# Patient Record
Sex: Female | Born: 1935 | Race: Black or African American | Hispanic: No | Smoking: Never smoker
Health system: Southern US, Community
[De-identification: ages and names within clinical notes are randomized; demographics above are authoritative.]

## PROBLEM LIST (undated history)

## (undated) DIAGNOSIS — E785 Hyperlipidemia, unspecified: Secondary | ICD-10-CM

## (undated) DIAGNOSIS — R1013 Epigastric pain: Secondary | ICD-10-CM

## (undated) DIAGNOSIS — D649 Anemia, unspecified: Secondary | ICD-10-CM

## (undated) DIAGNOSIS — K922 Gastrointestinal hemorrhage, unspecified: Secondary | ICD-10-CM

## (undated) DIAGNOSIS — R911 Solitary pulmonary nodule: Secondary | ICD-10-CM

## (undated) DIAGNOSIS — R531 Weakness: Secondary | ICD-10-CM

## (undated) DIAGNOSIS — I442 Atrioventricular block, complete: Secondary | ICD-10-CM

## (undated) DIAGNOSIS — Z950 Presence of cardiac pacemaker: Secondary | ICD-10-CM

## (undated) DIAGNOSIS — I10 Essential (primary) hypertension: Secondary | ICD-10-CM

## (undated) DIAGNOSIS — M25669 Stiffness of unspecified knee, not elsewhere classified: Secondary | ICD-10-CM

## (undated) DIAGNOSIS — I509 Heart failure, unspecified: Secondary | ICD-10-CM

## (undated) HISTORY — DX: Weakness: R53.1

## (undated) HISTORY — DX: Gastrointestinal hemorrhage, unspecified: K92.2

## (undated) HISTORY — DX: Essential (primary) hypertension: I10

## (undated) HISTORY — DX: Solitary pulmonary nodule: R91.1

## (undated) HISTORY — DX: Stiffness of unspecified knee, not elsewhere classified: M25.669

## (undated) HISTORY — DX: Epigastric pain: R10.13

## (undated) HISTORY — DX: Anemia, unspecified: D64.9

## (undated) HISTORY — PX: COLONOSCOPY: SHX174

## (undated) HISTORY — DX: Hyperlipidemia, unspecified: E78.5

## (undated) HISTORY — PX: UPPER GASTROINTESTINAL ENDOSCOPY: SHX188

## (undated) HISTORY — DX: Heart failure, unspecified: I50.9

## (undated) HISTORY — DX: Presence of cardiac pacemaker: Z95.0

## (undated) HISTORY — PX: CATARACT EXTRACTION: SUR2

---

## 1998-05-12 ENCOUNTER — Encounter (INDEPENDENT_AMBULATORY_CARE_PROVIDER_SITE_OTHER): Payer: Self-pay | Admitting: *Deleted

## 1999-06-09 ENCOUNTER — Encounter: Payer: Self-pay | Admitting: Internal Medicine

## 1999-06-09 ENCOUNTER — Encounter: Admission: RE | Admit: 1999-06-09 | Discharge: 1999-06-09 | Payer: Self-pay | Admitting: Internal Medicine

## 2000-07-03 ENCOUNTER — Encounter: Admission: RE | Admit: 2000-07-03 | Discharge: 2000-07-03 | Payer: Self-pay | Admitting: Internal Medicine

## 2000-07-03 ENCOUNTER — Encounter: Payer: Self-pay | Admitting: Internal Medicine

## 2001-06-19 ENCOUNTER — Encounter: Admission: RE | Admit: 2001-06-19 | Discharge: 2001-06-19 | Payer: Self-pay | Admitting: Internal Medicine

## 2001-06-19 ENCOUNTER — Encounter: Payer: Self-pay | Admitting: Internal Medicine

## 2001-12-12 ENCOUNTER — Ambulatory Visit (HOSPITAL_COMMUNITY): Admission: RE | Admit: 2001-12-12 | Discharge: 2001-12-12 | Payer: Self-pay | Admitting: Internal Medicine

## 2001-12-12 ENCOUNTER — Encounter: Payer: Self-pay | Admitting: Internal Medicine

## 2004-01-01 ENCOUNTER — Encounter: Admission: RE | Admit: 2004-01-01 | Discharge: 2004-01-01 | Payer: Self-pay | Admitting: Internal Medicine

## 2004-03-07 ENCOUNTER — Ambulatory Visit: Payer: Self-pay | Admitting: Family Medicine

## 2004-05-10 ENCOUNTER — Ambulatory Visit: Payer: Self-pay | Admitting: Internal Medicine

## 2004-06-17 ENCOUNTER — Ambulatory Visit: Payer: Self-pay | Admitting: Internal Medicine

## 2004-06-27 ENCOUNTER — Ambulatory Visit: Payer: Self-pay | Admitting: Gastroenterology

## 2004-06-28 ENCOUNTER — Ambulatory Visit: Payer: Self-pay | Admitting: Gastroenterology

## 2004-06-28 ENCOUNTER — Encounter (INDEPENDENT_AMBULATORY_CARE_PROVIDER_SITE_OTHER): Payer: Self-pay | Admitting: Specialist

## 2004-08-02 ENCOUNTER — Ambulatory Visit: Payer: Self-pay | Admitting: Gastroenterology

## 2004-11-01 ENCOUNTER — Ambulatory Visit: Payer: Self-pay | Admitting: Internal Medicine

## 2004-11-30 ENCOUNTER — Ambulatory Visit: Payer: Self-pay | Admitting: Internal Medicine

## 2005-05-11 ENCOUNTER — Ambulatory Visit: Payer: Self-pay | Admitting: Internal Medicine

## 2005-06-16 ENCOUNTER — Encounter: Admission: RE | Admit: 2005-06-16 | Discharge: 2005-06-16 | Payer: Self-pay | Admitting: Internal Medicine

## 2005-10-24 ENCOUNTER — Ambulatory Visit: Payer: Self-pay | Admitting: Internal Medicine

## 2005-11-22 ENCOUNTER — Ambulatory Visit: Payer: Self-pay | Admitting: Internal Medicine

## 2006-02-23 ENCOUNTER — Ambulatory Visit (HOSPITAL_COMMUNITY): Admission: RE | Admit: 2006-02-23 | Discharge: 2006-02-23 | Payer: Self-pay | Admitting: Internal Medicine

## 2006-02-23 ENCOUNTER — Ambulatory Visit: Payer: Self-pay | Admitting: Internal Medicine

## 2006-02-23 LAB — CONVERTED CEMR LAB
Albumin: 3.4 g/dL — ABNORMAL LOW (ref 3.5–5.2)
Alkaline Phosphatase: 66 units/L (ref 39–117)
BUN: 42 mg/dL — ABNORMAL HIGH (ref 6–23)
CO2: 30 meq/L (ref 19–32)
Calcium: 10 mg/dL (ref 8.4–10.5)
GFR calc Af Amer: 46 mL/min
GFR calc non Af Amer: 38 mL/min
Glucose, Bld: 134 mg/dL — ABNORMAL HIGH (ref 70–99)
HCT: 30.3 % — ABNORMAL LOW (ref 36.0–46.0)
Hemoglobin, Urine: NEGATIVE
Hemoglobin: 9.4 g/dL — ABNORMAL LOW (ref 12.0–15.0)
Ketones, ur: NEGATIVE mg/dL
Leukocytes, UA: NEGATIVE
Lipase: 28 units/L (ref 11.0–59.0)
Lymphocytes Relative: 12.5 % (ref 12.0–46.0)
MCHC: 31 g/dL (ref 30.0–36.0)
MCV: 88.3 fL (ref 78.0–100.0)
Monocytes Absolute: 0.6 10*3/uL (ref 0.2–0.7)
Monocytes Relative: 4 % (ref 3.0–11.0)
Nitrite: NEGATIVE
Potassium: 3.9 meq/L (ref 3.5–5.1)
RBC: 3.43 M/uL — ABNORMAL LOW (ref 3.87–5.11)
Specific Gravity, Urine: 1.015 (ref 1.000–1.03)
Total Bilirubin: 0.5 mg/dL (ref 0.3–1.2)
Total Protein, Urine: NEGATIVE mg/dL
Urine Glucose: NEGATIVE mg/dL
Urobilinogen, UA: 0.2 (ref 0.0–1.0)

## 2006-02-26 ENCOUNTER — Inpatient Hospital Stay (HOSPITAL_COMMUNITY): Admission: EM | Admit: 2006-02-26 | Discharge: 2006-03-03 | Payer: Self-pay | Admitting: Emergency Medicine

## 2006-02-26 ENCOUNTER — Encounter (INDEPENDENT_AMBULATORY_CARE_PROVIDER_SITE_OTHER): Payer: Self-pay | Admitting: *Deleted

## 2006-02-26 ENCOUNTER — Ambulatory Visit: Payer: Self-pay | Admitting: Internal Medicine

## 2006-03-05 ENCOUNTER — Ambulatory Visit: Payer: Self-pay | Admitting: Internal Medicine

## 2006-03-12 ENCOUNTER — Ambulatory Visit: Payer: Self-pay | Admitting: Internal Medicine

## 2006-03-12 LAB — CONVERTED CEMR LAB: HCT: 28.2 % — ABNORMAL LOW (ref 36.0–46.0)

## 2006-03-26 ENCOUNTER — Ambulatory Visit: Payer: Self-pay | Admitting: Gastroenterology

## 2006-05-08 ENCOUNTER — Ambulatory Visit: Payer: Self-pay | Admitting: Gastroenterology

## 2006-05-08 LAB — HM COLONOSCOPY

## 2006-07-30 ENCOUNTER — Ambulatory Visit: Payer: Self-pay | Admitting: Internal Medicine

## 2006-07-30 LAB — CONVERTED CEMR LAB
CO2: 30 meq/L (ref 19–32)
Cholesterol: 192 mg/dL (ref 0–200)
Creatinine, Ser: 1.1 mg/dL (ref 0.4–1.2)
GFR calc Af Amer: 61 mL/min
LDL Cholesterol: 118 mg/dL — ABNORMAL HIGH (ref 0–99)
Potassium: 3.5 meq/L (ref 3.5–5.1)
Total CHOL/HDL Ratio: 4
Triglycerides: 133 mg/dL (ref 0–149)

## 2006-07-31 ENCOUNTER — Ambulatory Visit: Payer: Self-pay | Admitting: Cardiology

## 2006-08-07 ENCOUNTER — Encounter: Admission: RE | Admit: 2006-08-07 | Discharge: 2006-08-07 | Payer: Self-pay | Admitting: Internal Medicine

## 2006-08-07 LAB — HM MAMMOGRAPHY: HM Mammogram: NORMAL

## 2006-11-15 ENCOUNTER — Ambulatory Visit: Payer: Self-pay | Admitting: Internal Medicine

## 2007-01-22 ENCOUNTER — Encounter: Payer: Self-pay | Admitting: Internal Medicine

## 2007-01-22 ENCOUNTER — Ambulatory Visit: Payer: Self-pay | Admitting: Internal Medicine

## 2007-01-22 DIAGNOSIS — D649 Anemia, unspecified: Secondary | ICD-10-CM

## 2007-01-22 DIAGNOSIS — H409 Unspecified glaucoma: Secondary | ICD-10-CM | POA: Insufficient documentation

## 2007-01-22 DIAGNOSIS — I10 Essential (primary) hypertension: Secondary | ICD-10-CM | POA: Insufficient documentation

## 2007-01-22 DIAGNOSIS — M810 Age-related osteoporosis without current pathological fracture: Secondary | ICD-10-CM | POA: Insufficient documentation

## 2007-01-22 DIAGNOSIS — E785 Hyperlipidemia, unspecified: Secondary | ICD-10-CM

## 2007-01-22 DIAGNOSIS — J984 Other disorders of lung: Secondary | ICD-10-CM

## 2007-01-22 LAB — CONVERTED CEMR LAB
Albumin: 3.6 g/dL (ref 3.5–5.2)
BUN: 15 mg/dL (ref 6–23)
Basophils Absolute: 0 10*3/uL (ref 0.0–0.1)
Basophils Relative: 0.3 % (ref 0.0–1.0)
CO2: 29 meq/L (ref 19–32)
Calcium: 9.7 mg/dL (ref 8.4–10.5)
Chloride: 106 meq/L (ref 96–112)
Cholesterol: 203 mg/dL (ref 0–200)
Creatinine, Ser: 1.2 mg/dL (ref 0.4–1.2)
Eosinophils Absolute: 0.1 10*3/uL (ref 0.0–0.6)
Eosinophils Relative: 1.3 % (ref 0.0–5.0)
GFR calc Af Amer: 55 mL/min
MCV: 87.7 fL (ref 78.0–100.0)
Monocytes Relative: 6.1 % (ref 3.0–11.0)
Neutrophils Relative %: 67.1 % (ref 43.0–77.0)
RBC: 4.02 M/uL (ref 3.87–5.11)
RDW: 13.8 % (ref 11.5–14.6)
Sodium: 144 meq/L (ref 135–145)
Total Bilirubin: 0.7 mg/dL (ref 0.3–1.2)
Triglycerides: 105 mg/dL (ref 0–149)
WBC: 8.4 10*3/uL (ref 4.5–10.5)

## 2007-01-23 ENCOUNTER — Telehealth: Payer: Self-pay | Admitting: Internal Medicine

## 2007-01-24 ENCOUNTER — Encounter: Payer: Self-pay | Admitting: Internal Medicine

## 2007-02-12 ENCOUNTER — Ambulatory Visit: Payer: Self-pay | Admitting: Cardiology

## 2007-02-14 ENCOUNTER — Encounter: Payer: Self-pay | Admitting: Internal Medicine

## 2007-08-01 ENCOUNTER — Ambulatory Visit: Payer: Self-pay | Admitting: Internal Medicine

## 2007-08-01 DIAGNOSIS — R5381 Other malaise: Secondary | ICD-10-CM

## 2007-08-01 DIAGNOSIS — K3189 Other diseases of stomach and duodenum: Secondary | ICD-10-CM

## 2007-08-01 DIAGNOSIS — R1013 Epigastric pain: Secondary | ICD-10-CM

## 2007-08-01 DIAGNOSIS — R5383 Other fatigue: Secondary | ICD-10-CM

## 2007-08-01 LAB — CONVERTED CEMR LAB
BUN: 21 mg/dL (ref 6–23)
Basophils Absolute: 0 10*3/uL (ref 0.0–0.1)
Basophils Relative: 0.2 % (ref 0.0–1.0)
CO2: 27 meq/L (ref 19–32)
Calcium: 9.6 mg/dL (ref 8.4–10.5)
Chloride: 108 meq/L (ref 96–112)
Eosinophils Relative: 0.7 % (ref 0.0–5.0)
GFR calc non Af Amer: 45 mL/min
Glucose, Bld: 92 mg/dL (ref 70–99)
Hemoglobin: 11.1 g/dL — ABNORMAL LOW (ref 12.0–15.0)
Iron: 76 ug/dL (ref 42–145)
Monocytes Absolute: 0.2 10*3/uL (ref 0.1–1.0)
Neutro Abs: 5 10*3/uL (ref 1.4–7.7)
RBC: 3.83 M/uL — ABNORMAL LOW (ref 3.87–5.11)
Sed Rate: 18 mm/hr (ref 0–22)
Sodium: 145 meq/L (ref 135–145)
Transferrin: 260.7 mg/dL (ref >=212.0)

## 2007-08-05 ENCOUNTER — Encounter: Payer: Self-pay | Admitting: Internal Medicine

## 2007-11-22 ENCOUNTER — Ambulatory Visit: Payer: Self-pay | Admitting: Internal Medicine

## 2008-01-27 ENCOUNTER — Ambulatory Visit: Payer: Self-pay | Admitting: Internal Medicine

## 2008-01-27 LAB — CONVERTED CEMR LAB
Albumin: 3.5 g/dL (ref 3.5–5.2)
Alkaline Phosphatase: 70 units/L (ref 39–117)
BUN: 19 mg/dL (ref 6–23)
Basophils Absolute: 0 10*3/uL (ref 0.0–0.1)
Basophils Relative: 0.6 % (ref 0.0–3.0)
Bilirubin, Direct: 0.1 mg/dL (ref 0.0–0.3)
Chloride: 107 meq/L (ref 96–112)
Cholesterol: 190 mg/dL (ref 0–200)
Eosinophils Relative: 1 % (ref 0.0–5.0)
GFR calc non Af Amer: 50 mL/min
Glucose, Bld: 91 mg/dL (ref 70–99)
LDL Cholesterol: 110 mg/dL — ABNORMAL HIGH (ref 0–99)
MCHC: 33.4 g/dL (ref 30.0–36.0)
Monocytes Relative: 6.4 % (ref 3.0–12.0)
Potassium: 4.1 meq/L (ref 3.5–5.1)
RDW: 13.1 % (ref 11.5–14.6)
Total CHOL/HDL Ratio: 4.2
Total Protein: 6.8 g/dL (ref 6.0–8.3)

## 2008-01-28 ENCOUNTER — Encounter: Payer: Self-pay | Admitting: Internal Medicine

## 2008-02-18 ENCOUNTER — Ambulatory Visit: Payer: Self-pay | Admitting: Cardiovascular Disease

## 2008-02-26 ENCOUNTER — Encounter: Payer: Self-pay | Admitting: Internal Medicine

## 2008-03-02 ENCOUNTER — Telehealth: Payer: Self-pay | Admitting: Internal Medicine

## 2008-03-10 ENCOUNTER — Ambulatory Visit (HOSPITAL_COMMUNITY): Admission: RE | Admit: 2008-03-10 | Discharge: 2008-03-10 | Payer: Self-pay | Admitting: Internal Medicine

## 2008-03-16 ENCOUNTER — Encounter: Payer: Self-pay | Admitting: Internal Medicine

## 2008-08-03 ENCOUNTER — Ambulatory Visit: Payer: Self-pay | Admitting: Internal Medicine

## 2008-08-03 LAB — CONVERTED CEMR LAB
Alkaline Phosphatase: 71 units/L (ref 39–117)
BUN: 22 mg/dL (ref 6–23)
Basophils Absolute: 0 10*3/uL (ref 0.0–0.1)
CO2: 29 meq/L (ref 19–32)
Chloride: 107 meq/L (ref 96–112)
Direct LDL: 131.3 mg/dL
Eosinophils Absolute: 0.1 10*3/uL (ref 0.0–0.7)
Eosinophils Relative: 1.7 % (ref 0.0–5.0)
GFR calc non Af Amer: 54.4 mL/min (ref >60)
Glucose, Bld: 90 mg/dL (ref 70–99)
HCT: 33.1 % — ABNORMAL LOW (ref 36.0–46.0)
Lymphocytes Relative: 27.7 % (ref 12.0–46.0)
Lymphs Abs: 2 10*3/uL (ref 0.7–4.0)
MCHC: 32.1 g/dL (ref 30.0–36.0)
MCV: 89.1 fL (ref 78.0–100.0)
Monocytes Absolute: 0.4 10*3/uL (ref 0.1–1.0)
Monocytes Relative: 5.8 % (ref 3.0–12.0)
Neutro Abs: 4.8 10*3/uL (ref 1.4–7.7)
Neutrophils Relative %: 64.3 % (ref 43.0–77.0)
Platelets: 204 10*3/uL (ref 150.0–400.0)
RDW: 13.3 % (ref 11.5–14.6)
Sodium: 147 meq/L — ABNORMAL HIGH (ref 135–145)
Total CHOL/HDL Ratio: 4

## 2008-08-11 ENCOUNTER — Ambulatory Visit: Payer: Self-pay | Admitting: Cardiology

## 2008-08-18 ENCOUNTER — Telehealth: Payer: Self-pay | Admitting: Internal Medicine

## 2008-09-15 ENCOUNTER — Telehealth: Payer: Self-pay | Admitting: Internal Medicine

## 2008-11-17 ENCOUNTER — Ambulatory Visit: Payer: Self-pay | Admitting: Internal Medicine

## 2009-01-12 ENCOUNTER — Ambulatory Visit: Payer: Self-pay | Admitting: Internal Medicine

## 2009-01-14 ENCOUNTER — Telehealth: Payer: Self-pay | Admitting: Internal Medicine

## 2009-01-14 ENCOUNTER — Ambulatory Visit: Payer: Self-pay | Admitting: Internal Medicine

## 2009-01-14 LAB — CONVERTED CEMR LAB
Ketones, ur: NEGATIVE mg/dL
Nitrite: POSITIVE
Specific Gravity, Urine: 1.02 (ref 1.000–1.030)
Total Protein, Urine: 100 mg/dL
Urobilinogen, UA: 0.2 (ref 0.0–1.0)
pH: 5 (ref 5.0–8.0)

## 2009-02-03 ENCOUNTER — Telehealth (INDEPENDENT_AMBULATORY_CARE_PROVIDER_SITE_OTHER): Payer: Self-pay | Admitting: *Deleted

## 2009-02-03 ENCOUNTER — Encounter (INDEPENDENT_AMBULATORY_CARE_PROVIDER_SITE_OTHER): Payer: Self-pay | Admitting: *Deleted

## 2009-07-27 ENCOUNTER — Ambulatory Visit: Payer: Self-pay | Admitting: Internal Medicine

## 2009-07-27 DIAGNOSIS — K5900 Constipation, unspecified: Secondary | ICD-10-CM | POA: Insufficient documentation

## 2009-07-27 LAB — CONVERTED CEMR LAB
AST: 27 units/L (ref 0–37)
Albumin: 4.1 g/dL (ref 3.5–5.2)
BUN: 28 mg/dL — ABNORMAL HIGH (ref 6–23)
Basophils Absolute: 0 10*3/uL (ref 0.0–0.1)
Basophils Relative: 0.3 % (ref 0.0–3.0)
Bilirubin, Direct: 0.7 mg/dL — ABNORMAL HIGH (ref 0.0–0.3)
CO2: 27 meq/L (ref 19–32)
Chloride: 110 meq/L (ref 96–112)
Eosinophils Absolute: 0.1 10*3/uL (ref 0.0–0.7)
Glucose, Bld: 99 mg/dL (ref 70–99)
LDL Cholesterol: 111 mg/dL — ABNORMAL HIGH (ref 0–99)
Lymphs Abs: 2.4 10*3/uL (ref 0.7–4.0)
Neutro Abs: 4.8 10*3/uL (ref 1.4–7.7)
Neutrophils Relative %: 62 % (ref 43.0–77.0)
RBC: 3.78 M/uL — ABNORMAL LOW (ref 3.87–5.11)
RDW: 14.9 % — ABNORMAL HIGH (ref 11.5–14.6)
Sodium: 146 meq/L — ABNORMAL HIGH (ref 135–145)
WBC: 7.7 10*3/uL (ref 4.5–10.5)

## 2009-08-03 ENCOUNTER — Ambulatory Visit: Payer: Self-pay | Admitting: Internal Medicine

## 2009-08-08 ENCOUNTER — Encounter: Payer: Self-pay | Admitting: Internal Medicine

## 2009-10-01 ENCOUNTER — Telehealth: Payer: Self-pay | Admitting: Internal Medicine

## 2009-10-27 ENCOUNTER — Ambulatory Visit: Payer: Self-pay | Admitting: Internal Medicine

## 2009-11-04 ENCOUNTER — Telehealth: Payer: Self-pay | Admitting: Internal Medicine

## 2009-11-08 ENCOUNTER — Telehealth: Payer: Self-pay | Admitting: Internal Medicine

## 2009-11-19 ENCOUNTER — Ambulatory Visit: Payer: Self-pay | Admitting: Internal Medicine

## 2009-12-14 ENCOUNTER — Telehealth: Payer: Self-pay | Admitting: Internal Medicine

## 2010-02-17 ENCOUNTER — Other Ambulatory Visit: Payer: Self-pay | Admitting: Internal Medicine

## 2010-02-17 ENCOUNTER — Ambulatory Visit
Admission: RE | Admit: 2010-02-17 | Discharge: 2010-02-17 | Payer: Self-pay | Source: Home / Self Care | Attending: Internal Medicine | Admitting: Internal Medicine

## 2010-02-17 DIAGNOSIS — M25669 Stiffness of unspecified knee, not elsewhere classified: Secondary | ICD-10-CM | POA: Insufficient documentation

## 2010-02-17 LAB — BASIC METABOLIC PANEL
BUN: 28 mg/dL — ABNORMAL HIGH (ref 6–23)
CO2: 22 mEq/L (ref 19–32)
Calcium: 9.3 mg/dL (ref 8.4–10.5)
Chloride: 112 mEq/L (ref 96–112)
Creatinine, Ser: 1.5 mg/dL — ABNORMAL HIGH (ref 0.4–1.2)
GFR: 42.56 mL/min — ABNORMAL LOW (ref >60.00)
Glucose, Bld: 76 mg/dL (ref 70–99)
Potassium: 4.2 mEq/L (ref 3.5–5.1)
Sodium: 143 mEq/L (ref 135–145)

## 2010-02-17 LAB — CBC WITH DIFFERENTIAL/PLATELET
Basophils Absolute: 0 10*3/uL (ref 0.0–0.1)
Basophils Relative: 0.1 % (ref 0.0–3.0)
Eosinophils Absolute: 0.1 10*3/uL (ref 0.0–0.7)
Eosinophils Relative: 0.9 % (ref 0.0–5.0)
HCT: 34.5 % — ABNORMAL LOW (ref 36.0–46.0)
Hemoglobin: 11.1 g/dL — ABNORMAL LOW (ref 12.0–15.0)
Lymphocytes Relative: 30.2 % (ref 12.0–46.0)
Lymphs Abs: 2.2 10*3/uL (ref 0.7–4.0)
MCHC: 32.2 g/dL (ref 30.0–36.0)
MCV: 88.9 fl (ref 78.0–100.0)
Monocytes Absolute: 0.4 10*3/uL (ref 0.1–1.0)
Monocytes Relative: 6.1 % (ref 3.0–12.0)
Neutro Abs: 4.5 10*3/uL (ref 1.4–7.7)
Neutrophils Relative %: 62.7 % (ref 43.0–77.0)
Platelets: 228 10*3/uL (ref 150.0–400.0)
RBC: 3.89 Mil/uL (ref 3.87–5.11)
RDW: 14.9 % — ABNORMAL HIGH (ref 11.5–14.6)
WBC: 7.2 10*3/uL (ref 4.5–10.5)

## 2010-02-17 LAB — B12 AND FOLATE PANEL
Folate: 11.4 ng/mL
Vitamin B-12: 237 pg/mL (ref 211–911)

## 2010-02-17 LAB — IBC PANEL
Iron: 87 ug/dL (ref 42–145)
Saturation Ratios: 23.6 % (ref 20.0–50.0)
Transferrin: 263 mg/dL (ref 212.0–360.0)

## 2010-02-27 ENCOUNTER — Encounter: Payer: Self-pay | Admitting: Internal Medicine

## 2010-02-28 ENCOUNTER — Encounter: Payer: Self-pay | Admitting: Internal Medicine

## 2010-03-10 NOTE — Assessment & Plan Note (Signed)
Summary: PER [facility] DOUBLEBOOK FOR A 945A APPT--FREQ BM-WEAK-STC   Vital Signs:  Patient profile:   77 year old female Height:      65 inches Weight:      121 pounds BMI:     20.21 O2 Sat:      99 % on Room air Temp:     97.1 degrees F oral Pulse rate:   87 / minute BP sitting:   128 / 80  (left arm) Cuff size:   regular  Vitals Entered By: Bill Salinas CMA (October 27, 2009 10:02 AM)  O2 Flow:  Room air CC: pt c/o freq bms x several weeks coming and going with occasional bloating . She Denies blood in her stool/ ab   Primary Care Provider:  [facility]  CC:  pt c/o freq bms x several weeks coming and going with occasional bloating . She Denies blood in her stool/ ab.  History of Present Illness: Patient presents with a month history of increased frequency of BMs of loose stool. She has up to 4 loose stools a night. She has had some low abdominal pain but no severe pain or fever. There has been no blood or mucus in the stool. No new foods or new restaurants, no out of state travel, no exposure risk.   Current Medications (verified): 1)  Enalapril-Hydrochlorothiazide 10-25 Mg Tabs (Enalapril-Hydrochlorothiazide) .... Take 1 Tablet By Mouth Once A Day 2)  Lovastatin 20 Mg  Tabs (Lovastatin) .... Take 1 Tablet By Mouth Once A Day 3)  Loratadine 10 Mg  Tabs (Loratadine) .... Take 1 Tablet By Mouth Once A Day As Needed 4)  Aleve 220 Mg  Tabs (Naproxen Sodium) .... As Needed 5)  Tums 500 Mg  Chew (Calcium Carbonate Antacid) .... As Needed 6)  Evista 60 Mg  Tabs (Raloxifene Hcl) .... Take 1 Tablet By Mouth Once A Day 7)  Xalatan 0.005 %  Soln (Latanoprost) .... At Bedtime 8)  Ranitidine Hcl 150 Mg Caps (Ranitidine Hcl) .Marland Kitchen.. 1 By Mouth Two Times A Day X 2 Weeks Then At Bedtime 9)  Metamucil 30.9 % Powd (Psyllium) .... One Dose Once Daily  Allergies (verified): 1)  Actonel (Risedronate Sodium)  Past History:  Past Medical History: Last updated:  01/22/2007 Hyperlipidemia Hypertension Osteoporosis GLAUCOMA (ICD-365.9)  Past Surgical History: Last updated: 01/22/2007 Cataract extraction PSH reviewed for relevance, FH reviewed for relevance  Review of Systems  The patient denies anorexia, fever, weight loss, decreased hearing, hoarseness, chest pain, dyspnea on exertion, headaches, abdominal pain, severe indigestion/heartburn, hematuria, difficulty walking, unusual weight change, and enlarged lymph nodes.    Physical Exam  General:  elderly well preserved AA female in no acute distress Head:  normocephalic and atraumatic.   Eyes:  C&S clear Neck:  supple.   Lungs:  normal respiratory effort and normal breath sounds.   Heart:  normal rate and regular rhythm.   Abdomen:   BS + x 4 quadrants - hollow sound with rushes; no guarding or rebound tenderness, no tenderness to percussion or palpation.  Skin:  turgor normal and color normal.  No jaundice Psych:  Oriented X3 and memory intact for recent and remote.     Impression & Recommendations:  Problem # 1:  LOOSE STOOLS (ICD-787.91) Suspect mild IBS type symptoms with no evidence of infection or obstruction.  Plan - 2 view abdomen to r/o obstruction, free air.           increase bulk laxative dosing, if no improvement will  try cholestyramine.  Orders: T-Abdomen 2-view ([id number])  Addendum - x-ray with no acute abnormalities.   Complete Medication List: 1)  Enalapril-hydrochlorothiazide 10-25 Mg Tabs (Enalapril-hydrochlorothiazide) .... Take 1 tablet by mouth once a day 2)  Lovastatin 20 Mg Tabs (Lovastatin) .... Take 1 tablet by mouth once a day 3)  Loratadine 10 Mg Tabs (Loratadine) .... Take 1 tablet by mouth once a day as needed 4)  Aleve 220 Mg Tabs (Naproxen sodium) .... As needed 5)  Tums 500 Mg Chew (Calcium carbonate antacid) .... As needed 6)  Evista 60 Mg Tabs (Raloxifene hcl) .... Take 1 tablet by mouth once a day 7)  Xalatan 0.005 % Soln (Latanoprost) ....  At bedtime 8)  Ranitidine Hcl 150 Mg Caps (Ranitidine hcl) .Marland Kitchen.. 1 by mouth two times a day x 2 weeks then at bedtime 9)  Metamucil 30.9 % Powd (Psyllium) .... One dose once daily  Other Orders: Flu Vaccine 66yrs + MEDICARE PATIENTS (J1914) Administration Flu vaccine - MCR (N8295)  Patient Instructions: 1)  Loose and frequent stooling - exam is negative for any sign of infection. No sign or findings to suggestion obstruction. There is gaseous distention. Plan - two view x-ray of abdomen; increase metamucil to twice a day. If these doesn't regulate the bowels call and cholestyramine will be prescribed. Call for pain, fever, stool with blood or mucus.  2)  Knee pain - osteoarthritis. Take tylenol for pain - 500mg  to 1000mg  up to three times a day. If not adequate relief take aleve.      Flu Vaccine Consent Questions     Do you have a history of severe allergic reactions to this vaccine? no    Any prior history of allergic reactions to egg and/or gelatin? no    Do you have a sensitivity to the preservative Thimersol? no    Do you have a past history of Guillan-Barre Syndrome? no    Do you currently have an acute febrile illness? no    Have you ever had a severe reaction to latex? no    Vaccine information given and explained to patient? yes    Are you currently pregnant? no    Lot Number:[id number]   Exp Date:08/06/2010   Site Given  Left Deltoid IMflu

## 2010-03-10 NOTE — Progress Notes (Signed)
Summary: RX  Phone Note Call from Patient Call back at Home Phone [phone]   Caller: Daughter [name] [phone]  or [name] [phone] Summary of Call: Pt has not gotten relief or loose stools as discussed from metimucil. They would like rx discussed at last office visit.   Copied & pasted from last ov notes:  If these doesn't regulate the bowels call and cholestyramine will be prescribed  Please give details of rx, thanks Initial call taken by: Lamar Sprinkles, CMA,  November 04, 2009 12:08 PM  Follow-up for Phone Call        Rx - cholesytramine 4g packets: #30, 1 pkt in water two times a day.  Follow-up by: Jacques Navy MD,  November 04, 2009 12:57 PM  Additional Follow-up for Phone Call Additional follow up Details #1::        Pt's daughter [name] informed in detailed and expressed understanding. Additional Follow-up by: Margaret Pyle, CMA,  November 04, 2009 3:51 PM    New/Updated Medications: CHOLESTYRAMINE 4 GM PACK (CHOLESTYRAMINE) 1 packet in water two times a day Prescriptions: CHOLESTYRAMINE 4 GM PACK (CHOLESTYRAMINE) 1 packet in water two times a day  #60 x 1   Entered by:   Lamar Sprinkles, CMA   Authorized by:   Jacques Navy MD   Signed by:   Lamar Sprinkles, CMA on 11/04/2009   Method used:   Electronically to        [facility] [phone]* (retail)       [address]       [place], [place]  [address]       Ph: [phone] or [phone]       Fax: [phone]   RxID:   [id number]

## 2010-03-10 NOTE — Assessment & Plan Note (Signed)
Summary: f/u appt/cd   Vital Signs:  Patient profile:   77 year old female Height:      65 inches Weight:      121 pounds BMI:     20.21 Temp:     97.5 degrees F oral Pulse rate:   76 / minute Pulse rhythm:   regular Resp:     16 per minute BP sitting:   136 / 76  (right arm) Cuff size:   regular  Vitals Entered By: Lanier Prude, CMA(AAMA) (February 17, 2010 1:12 PM) CC: f/u  Is Patient Diabetic? No Comments pt is not taking prometh/codeine syrup   Primary Care Provider:  Zahmir Lalla  CC:  f/u .  History of Present Illness: Last seen in october for increased  bowel frequency.She was started on cholestyraimne two times a day but had slowed bowels and cut back to 1 dose a day. She is doing bewtter. she has done a lot better with ranitidine two times a day in terms of abdominal pain .  She reports that she has stiffness in her legs, right more then left. No swelling or synvoial thickening.   She has a poor appetite and has lost 1 lb per month. She had been on elder tonic in the past which did help her appetitie. Recommend she restart this.   Current Medications (verified): 1)  Enalapril-Hydrochlorothiazide 10-25 Mg Tabs (Enalapril-Hydrochlorothiazide) .... Take 1 Tablet By Mouth Once A Day 2)  Loratadine 10 Mg  Tabs (Loratadine) .... Take 1 Tablet By Mouth Once A Day As Needed 3)  Aleve 220 Mg  Tabs (Naproxen Sodium) .... As Needed 4)  Tums 500 Mg  Chew (Calcium Carbonate Antacid) .... As Needed 5)  Xalatan 0.005 %  Soln (Latanoprost) .... At Bedtime 6)  Ranitidine Hcl 150 Mg Caps (Ranitidine Hcl) .Marland Kitchen.. 1 By Mouth Two Times A Day X 2 Weeks Then At Bedtime 7)  Cholestyramine 4 Gm Pack (Cholestyramine) .Marland Kitchen.. 1 Packet in Water Two Times A Day 8)  Promethazine-Codeine 6.25-10 Mg/6ml Syrp (Promethazine-Codeine) .Marland Kitchen.. 1 Tsp Every 6 Hours As Needed Cough  Allergies (verified): 1)  Actonel (Risedronate Sodium)  Past History:  Past Medical History: Last updated:  01/22/2007 Hyperlipidemia Hypertension Osteoporosis GLAUCOMA (ICD-365.9)  Past Surgical History: Last updated: 01/22/2007 Cataract extraction  Social History: Last updated: 07/27/2009 has a very supportive family. remains I-ADLs except for driving. End of Life - Care: discussed with patinet and dtr: DNR. Provided end of life materials, out of facility order and MOST form.  Review of Systems  The patient denies anorexia, fever, weight loss, decreased hearing, hoarseness, chest pain, dyspnea on exertion, abdominal pain, muscle weakness, difficulty walking, abnormal bleeding, and enlarged lymph nodes.    Physical Exam  General:  elderly AA woman in no distress Head:  Normocephalic and atraumatic without obvious abnormalities. No apparent alopecia or balding. Eyes:  pupils equal and pupils round.  C&S clear Ears:  R ear normal and L ear normal.   Nose:  no external deformity and no external erythema.   Mouth:  good dentition, no gingival abnormalities, and no dental plaque.   Neck:  supple, full ROM, and no thyromegaly.   Chest Wall:  no deformities and no tenderness.   Lungs:  normal respiratory effort and normal breath sounds.   Heart:  normal rate, no JVD, and no HJR.   Abdomen:  soft, non-tender, normal bowel sounds, and no guarding.   Msk:  normal ROM, no joint tenderness, no joint swelling, no joint  warmth, and no joint deformities.   Pulses:  2 radial  Extremities:  No clubbing, cyanosis, edema, or deformity noted with normal full range of motion of all joints.   Neurologic:  alert & oriented X3, cranial nerves II-XII intact, sensation intact to pinprick, and gait normal.   Skin:  turgor normal, color normal, no petechiae, and no ulcerations.  Has a comedone at the left scapula - no infection or irritation.  Cervical Nodes:  no anterior cervical adenopathy and no posterior cervical adenopathy.   Psych:  Oriented X3, memory intact for recent and remote, normally interactive,  and good eye contact.     Impression & Recommendations:  Problem # 1:  CONSTIPATION (ICD-564.00) Bowel habit is now nicely regulated. Will continue present medications and regimen  Problem # 2:  ANEMIA (ICD-285.9)  Orders: TLB-BMP (Basic Metabolic Panel-BMET) (80048-METABOL) TLB-CBC Platelet - w/Differential (85025-CBCD) TLB-B12 + Folate Pnl (01027_25366-Y40/HKV) TLB-IBC Pnl (Iron/FE;Transferrin) (83550-IBC)  Addendum - Hgb 11.1, iron stores are replete at low normal range. B12 is low normal. No need to continue iron at this time.   Problem # 3:  HYPERTENSION (ICD-401.9)  Her updated medication list for this problem includes:    Enalapril-hydrochlorothiazide 10-25 Mg Tabs (Enalapril-hydrochlorothiazide) .Marland Kitchen... Take 1 tablet by mouth once a day  Orders: TLB-BMP (Basic Metabolic Panel-BMET) (80048-METABOL)  BP today: 136/76 Prior BP: 138/72 (11/19/2009)  Good control. Labs are normal.  Plan - continue present regimen  Problem # 4:  STIFFNESS OF JOINT NEC LOWER LEG (ICD-719.56) Patient c/o continued stiffness in the right knee. Exam is unrevealing.  Plan - no new medications           exercise and stretching are the best therapy  Complete Medication List: 1)  Enalapril-hydrochlorothiazide 10-25 Mg Tabs (Enalapril-hydrochlorothiazide) .... Take 1 tablet by mouth once a day 2)  Loratadine 10 Mg Tabs (Loratadine) .... Take 1 tablet by mouth once a day as needed 3)  Aleve 220 Mg Tabs (Naproxen sodium) .... As needed 4)  Tums 500 Mg Chew (Calcium carbonate antacid) .... As needed 5)  Xalatan 0.005 % Soln (Latanoprost) .... At bedtime 6)  Ranitidine Hcl 150 Mg Caps (Ranitidine hcl) .Marland Kitchen.. 1 by mouth two times a day x 2 weeks then at bedtime 7)  Cholestyramine 4 Gm Pack (Cholestyramine) .Marland Kitchen.. 1 packet in water two times a day 8)  Promethazine-codeine 6.25-10 Mg/53ml Syrp (Promethazine-codeine) .Marland Kitchen.. 1 tsp every 6 hours as needed cough Prescriptions: CHOLESTYRAMINE 4 GM PACK  (CHOLESTYRAMINE) 1 packet in water two times a day  #60 Packet x 12   Entered and Authorized by:   Jacques Navy MD   Signed by:   Jacques Navy MD on 02/17/2010   Method used:   [facility] to        [facility] [phone][facility] ([facility]ail)       [address]       [place], [place]  [address]       Ph: [phone] or [phone]       Fax: [phone]   RxID:   [id number]    Orders Added: 1)  TLB-BMP (Basic Metabolic Panel-BMET) [80048-METABOL] 2)  TLB-CBC Platelet - w/Differential [85025-CBCD] 3)  TLB-B12 + Folate Pnl [82746_82607-B12/FOL] 4)  TLB-IBC Pnl (Iron/FE;Transferrin) [83550-IBC] 5)  Est. Patient Level III [28315]

## 2010-03-10 NOTE — Assessment & Plan Note (Signed)
Summary: 6 mos f/u per daughter/#/cd   Vital Signs:  Patient profile:   77 year old female Height:      65 inches Weight:      125 pounds BMI:     20.88 O2 Sat:      97 % on Room air Temp:     96.8 degrees F oral Pulse rate:   82 / minute BP sitting:   138 / 80  (left arm) Cuff size:   regular  Vitals Entered By: Bill Salinas CMA (July 27, 2009 11:10 AM)  O2 Flow:  Room air CC: pt here for 6 month f/u / ab   Primary Care Provider:  Norins  CC:  pt here for 6 month f/u / ab.  History of Present Illness: Patient for medical follow-up. She reports that she has increased stiffness "behind the knees" especially with position change.  She reports urgency for defecation after eating. In addition she is having increased bloating and flatus with certain foods, beans, etc. She has been taking prilosec every other day with good results.   She is otherwise doing OK.   Current Medications (verified): 1)  Enalapril-Hydrochlorothiazide 10-25 Mg Tabs (Enalapril-Hydrochlorothiazide) .... Take 1 Tablet By Mouth Once A Day 2)  Lovastatin 20 Mg  Tabs (Lovastatin) .... Take 1 Tablet By Mouth Once A Day 3)  Omeprazole 20 Mg Cpdr (Omeprazole) .... Take 1 Capsule By Mouth Once A Day 4)  Loratadine 10 Mg  Tabs (Loratadine) .... Take 1 Tablet By Mouth Once A Day As Needed 5)  Aleve 220 Mg  Tabs (Naproxen Sodium) .... As Needed 6)  Tums 500 Mg  Chew (Calcium Carbonate Antacid) .... As Needed 7)  Evista 60 Mg  Tabs (Raloxifene Hcl) .... Take 1 Tablet By Mouth Once A Day 8)  Xalatan 0.005 %  Soln (Latanoprost) .... At Bedtime  Allergies (verified): 1)  Actonel (Risedronate Sodium)  Past History:  Past Medical History: Last updated: 01/22/2007 Hyperlipidemia Hypertension Osteoporosis GLAUCOMA (ICD-365.9)  Past Surgical History: Last updated: 01/22/2007 Cataract extraction  Family History: Last updated: 08/01/2007 non-contributory at patinets age.  Social History: Last updated:  07/27/2009 has a very supportive family. remains I-ADLs except for driving. End of Life - Care: discussed with patinet and dtr: DNR. Provided end of life materials, out of facility order and MOST form.  Social History: has a very supportive family. remains I-ADLs except for driving. End of Life - Care: discussed with patinet and dtr: DNR. Provided end of life materials, out of facility order and MOST form.  Review of Systems  The patient denies anorexia, fever, chest pain, syncope, prolonged cough, abdominal pain, severe indigestion/heartburn, muscle weakness, suspicious skin lesions, difficulty walking, and enlarged lymph nodes.    Physical Exam  General:  Elderly AA female who looks younger than stated age. Head:  Normocephalic and atraumatic without obvious abnormalities. No apparent alopecia or balding. Eyes:  pupils equal, pupils round, corneas and lenses clear, and no injection.   Ears:  External ear exam shows no significant lesions or deformities.  Otoscopic examination reveals clear canals, tympanic membranes are intact bilaterally without bulging, retraction, inflammation or discharge. Hearing is grossly normal bilaterally. Neck:  supple, no masses, and no thyromegaly.   Lungs:  Normal respiratory effort, chest expands symmetrically. Lungs are clear to auscultation, no crackles or wheezes. Heart:  Normal rate and regular rhythm. S1 and S2 normal without gallop, murmur, click, rub or other extra sounds. Abdomen:  soft, non-tender, and normal bowel sounds.  Msk:  no joint tenderness, no joint warmth, no redness over joints, and no joint deformities.   Pulses:  2+ radial Extremities:  no edema Neurologic:  alert & oriented X3, cranial nerves II-XII intact, and gait normal.   Skin:  turgor normal and color normal.   Psych:  Oriented X3, memory intact for recent and remote, normally interactive, and good eye contact.     Impression & Recommendations:  Problem # 1:  PULMONARY  NODULE, RIGHT UPPER LOBE (ICD-518.89) Nodule has been stable. PET scan was not diagnostic  Plan - f/u CT chest w/o contrast  Orders: Radiology Referral (Radiology)  Problem # 2:  HYPERTENSION (ICD-401.9)  Her updated medication list for this problem includes:    Enalapril-hydrochlorothiazide 10-25 Mg Tabs (Enalapril-hydrochlorothiazide) .Marland Kitchen... Take 1 tablet by mouth once a day  Orders: TLB-BMP (Basic Metabolic Panel-BMET) (80048-METABOL)  BP today: 138/80 Prior BP: 114/86 (01/12/2009)  Adequate control of BP, labs are good with normal potassium and kidney function  Problem # 3:  CONSTIPATION (ICD-564.00) Increase gastric colic reflex. Also - fiber digestion issues with bloating and gas.  Plan - beano for gas           MOM daily for bowel habit  Problem # 4:  DYSPEPSIA, CHRONIC (ICD-536.8) PPI may contribute to bowel issues. Exlained potential for rebound if we stop PPI  Plan transition to Ranitidine 150mg  two times a day         continue PPI for 1 week        after 1 month decrese ranitidine to at bedtime.  Problem # 5:  HYPERLIPIDEMIA (ICD-272.4) For follow up lab with recommendations to follow  Her updated medication list for this problem includes:    Lovastatin 20 Mg Tabs (Lovastatin) .Marland Kitchen... Take 1 tablet by mouth once a day  Orders: TLB-Lipid Panel (80061-LIPID) TLB-Hepatic/Liver Function Pnl (80076-HEPATIC)  Addendum - good control with LDL 111  Problem # 6:  ANEMIA (ICD-285.9) Follow up lab  Orders: TLB-CBC Platelet - w/Differential (85025-CBCD)  Addendum - mild persistent anemia.  Problem # 7:  End of lIfe Care Discussed with patient and daughter.  Patient states she does not want cardiac resuscitation or ventilator support.  Plan  provided signed out of facility order         gave living will packer with POA and living will  for patient and familty to discuss and execute         MOST form - presented with discussion for patient and family to  discuss.  Complete Medication List: 1)  Enalapril-hydrochlorothiazide 10-25 Mg Tabs (Enalapril-hydrochlorothiazide) .... Take 1 tablet by mouth once a day 2)  Lovastatin 20 Mg Tabs (Lovastatin) .... Take 1 tablet by mouth once a day 3)  Loratadine 10 Mg Tabs (Loratadine) .... Take 1 tablet by mouth once a day as needed 4)  Aleve 220 Mg Tabs (Naproxen sodium) .... As needed 5)  Tums 500 Mg Chew (Calcium carbonate antacid) .... As needed 6)  Evista 60 Mg Tabs (Raloxifene hcl) .... Take 1 tablet by mouth once a day 7)  Xalatan 0.005 % Soln (Latanoprost) .... At bedtime 8)  Ranitidine Hcl 150 Mg Caps (Ranitidine hcl) .Marland Kitchen.. 1 by mouth two times a day x 2 weeks then at bedtime  t: Zyair Cordell Note: All result statuses are Final unless otherwise noted.  Tests: (1) BMP (METABOL)   Sodium               [H]  146 mEq/L  135-145   Potassium                 4.1 mEq/L                   3.5-5.1   Chloride                  110 mEq/L                   96-112   Carbon Dioxide            27 mEq/L                    19-32   Glucose                   99 mg/dL                    13-08   BUN                  [H]  28 mg/dL                    6-57   Creatinine           [H]  1.4 mg/dL                   8.4-6.9   Calcium                   9.0 mg/dL                   6.2-95.2   GFR                       45.06 mL/min                >60  Tests: (2) Lipid Panel (LIPID)   Cholesterol               197 mg/dL                   8-413     ATP III Classification            Desirable:  < 200 mg/dL                    Borderline High:  200 - 239 mg/dL               High:  > = 240 mg/dL   Triglycerides        [H]  172.0 mg/dL                 2.4-401.0     Normal:  <150 mg/dL     Borderline High:  272 - 199 mg/dL   HDL                       53.66 mg/dL                 >44.03   VLDL Cholesterol          34.4 mg/dL                  4.7-42.5   LDL Cholesterol      [H]  956 mg/dL                   3-87  CHO/HDL Ratio:  CHD Risk                             4                    Men          Women     1/2 Average Risk     3.4          3.3     Average Risk          5.0          4.4     2X Average Risk          9.6          7.1     3X Average Risk          15.0          11.0                           Tests: (3) Hepatic/Liver Function Panel (HEPATIC)   Total Bilirubin           1.0 mg/dL                   1.6-1.0   Direct Bilirubin     [H]  0.7 mg/dL                   9.6-0.4     specimen grossly hemolyzed   Alkaline Phosphatase      68 U/L                      39-117   AST                       27 U/L                      0-37   ALT                       7 U/L                       0-35   Total Protein             7.5 g/dL                    5.4-0.9   Albumin                   4.1 g/dL                    8.1-1.9  Tests: (4) CBC Platelet w/Diff (CBCD)   White Cell Count          7.7 K/uL                    4.5-10.5   Red Cell Count       [L]  3.78 Mil/uL                 3.87-5.11   Hemoglobin           [L]  10.8 g/dL                   14.7-82.9   Hematocrit           [  L]  33.4 %                      36.0-46.0   MCV                       88.5 fl                     78.0-100.0   MCHC                      32.3 g/dL                   16.1-09.6   RDW                  [H]  14.9 %                      11.5-14.6   Platelet Count            195.0 K/uL                  150.0-400.0   Neutrophil %              62.0 %                      43.0-77.0   Lymphocyte %              30.6 %                      12.0-46.0   Monocyte %                6.3 %                       3.0-12.0   Eosinophils%              0.8 %                       0.0-5.0   Basophils %               0.3 %                       0.0-3.0   Neutrophill Absolute      4.8 K/uL                    1.4-7.7   Lymphocyte Absolute       2.4 K/uL                    0.7-4.0   Monocyte Absolute         0.5 K/uL                    0.1-1.0   Eosinophils, Absolute                             0.1 K/uL                    0.0-0.7   Basophils Absolute        0.0 K/uL                    0.0-0.1  Patient Instructions: 1)  Blood  pressuree - good control. continue present medication 2)  Stiff legs - old [place]. Lineament of choice, Tylenol 3)  Stomach issues - try Beano or avoid foods that cause problems. Will transition off prilosec: start ranitidine two times a day (otc) and after taking it for a week stop the prilosec. Continue the ranitidine two times a day for 2 weeks then switch to just at bedtime. 4)  Cholesterol - lab today., 5)  Pulmonary nodule - one last look with CT scan 6)  Life care - review the materials from today and complete forms as you see fit.  Prescriptions: EVISTA 60 MG  TABS (RALOXIFENE HCL) Take 1 tablet by mouth once a day  #30 x 12   Entered and Authorized by:   Jacques Navy MD   Signed by:   Jacques Navy MD on 07/27/2009   Method used:   Electronically to        [facility] [phone]* (retail)       [address]       [place], [place]  [address]       Ph: [phone] or [phone]       Fax: [phone]   RxID:   [id number] LOVASTATIN 20 MG  TABS (LOVASTATIN) Take 1 tablet by mouth once a day  #30 Each x 12   Entered and Authorized by:   Jacques Navy MD   Signed by:   Jacques Navy MD on 07/27/2009   Method used:   Electronically to        [facility] [phone]* (retail)       [address]       [place], [place]  [address]       Ph: [phone] or [phone]       Fax: [phone]   RxID:   [id number] ENALAPRIL-HYDROCHLOROTHIAZIDE 10-25 MG TABS (ENALAPRIL-HYDROCHLOROTHIAZIDE) Take 1 tablet by mouth once a day  #30 Tablet x 12   Entered and Authorized by:   Jacques Navy MD   Signed by:   Jacques Navy MD on 07/27/2009   Method used:   Electronically to        [facility] [phone]* (retail)       [address]       [place], [place]  [address]       Ph: [phone]  or [phone]       Fax: [phone]   RxID:   [id number]

## 2010-03-10 NOTE — Letter (Signed)
   [facility] [address] [place], [place]  [address] Phone: [phone]      August 08, 2009   [facility] [address] [place], [place] [address]  RE:  LAB RESULTS  Dear  Ms. Hodgkins,  The following is an interpretation of your most recent lab tests.  Please take note of any instructions provided or changes to medications that have resulted from your lab work.   CTscan shows no change in the nodule we have been following. No need for further studies.     Call or e-mail me if you have questions ([email or web address]).   Sincerely Yours,    Jacques Navy MD  T CHEST W/O CM [phone]   Clinical Data: Follow-up lung nodule   CT CHEST WITHOUT CONTRAST   Technique:  Multidetector CT imaging of the chest was performed following the standard protocol without IV contrast.   Comparison: CT 08/11/2008   Findings: Right lower lobe rounded pulmonary nodule measures 15 mm x 16 mm not significantly changed from 15 mm x 15 mm on prior.  No new pulmonary nodules.   No evidence of axillary or supraclavicular lymphadenopathy.  No mediastinal or hilar lymphadenopathy.  There is atelectasis along the left heart border which is also similar to prior.  No pericardial effusion.   Limited view of the upper abdomen is unremarkable.   IMPRESSION:   1.  No change and right lower lobe 1.5 cm nodule compared back to CT of 02/18/2008.  The  nodule had mild metabolic activity on comparison PET CT scan 03/10/2008. 2.  Stable atelectasis along the left heart border.   Read By:  Genevive Bi,  M.D.

## 2010-03-10 NOTE — Progress Notes (Signed)
Summary: REQ FOR RX  Phone Note Call from Patient   Caller: [phone] GAIL - Daughter Summary of Call: Pt c/o productive cough w/clear mucus. OTC cough med has given no relief. Patient is requesting rx for cough med before 2pm. Initial call taken by: Lamar Sprinkles, CMA,  December 14, 2009 12:22 PM  Follow-up for Phone Call        ok for phenergan/cod 1 tsp q6 8 oz no refills. If not better soon - ov Follow-up by: Jacques Navy MD,  December 14, 2009 1:04 PM  Additional Follow-up for Phone Call Additional follow up Details #1::        Pt informed  Additional Follow-up by: Lamar Sprinkles, CMA,  December 14, 2009 3:03 PM    New/Updated Medications: PROMETHAZINE-CODEINE 6.25-10 MG/5ML SYRP (PROMETHAZINE-CODEINE) 1 tsp every 6 hours as needed cough Prescriptions: PROMETHAZINE-CODEINE 6.25-10 MG/5ML SYRP (PROMETHAZINE-CODEINE) 1 tsp every 6 hours as needed cough  #8 x 0   Entered by:   Lamar Sprinkles, CMA   Authorized by:   Jacques Navy MD   Signed by:   Lamar Sprinkles, CMA on 12/14/2009   Method used:   Telephoned to ...       [facility] [address] (retail)       [address]       [place], [place]  [address]       Ph: [phone] or [phone]       Fax: [phone]   RxID:   [id number]

## 2010-03-10 NOTE — Assessment & Plan Note (Signed)
Summary: PER DAU/GAIL STOMACH PROBLEM--STC   Vital Signs:  Patient profile:   77 year old female Height:      65 inches Weight:      124 pounds BMI:     20.71 O2 Sat:      96 % on Room air Temp:     96.7 degrees F oral Pulse rate:   92 / minute BP sitting:   138 / 72  (left arm) Cuff size:   regular  Vitals Entered By: Bill Salinas CMA (November 19, 2009 5:05 PM)  O2 Flow:  Room air CC: pt here with c/o decreased appetite, pt c/o loose stools but states not diarrhea that the cholestyramine is helping/ ab   Primary Care Provider:  Norins  CC:  pt here with c/o decreased appetite and pt c/o loose stools but states not diarrhea that the cholestyramine is helping/ ab.  History of Present Illness: Patient presents for on-gong trouble with her bowels. Her complaint is multiple bowel movements usually in the evening after retiring. She does report that the stool has more formed consistency since starting cholestyramine. she is not have multiple diarrheal stools during the day. No blood or mucus in the stool. NO tenesmus or rectal pain. She does have lower abdominal bloating at times along with mild  borborygmi.   Current Medications (verified): 1)  Enalapril-Hydrochlorothiazide 10-25 Mg Tabs (Enalapril-Hydrochlorothiazide) .... Take 1 Tablet By Mouth Once A Day 2)  Lovastatin 20 Mg  Tabs (Lovastatin) .... Take 1 Tablet By Mouth Once A Day 3)  Loratadine 10 Mg  Tabs (Loratadine) .... Take 1 Tablet By Mouth Once A Day As Needed 4)  Aleve 220 Mg  Tabs (Naproxen Sodium) .... As Needed 5)  Tums 500 Mg  Chew (Calcium Carbonate Antacid) .... As Needed 6)  Evista 60 Mg  Tabs (Raloxifene Hcl) .... Take 1 Tablet By Mouth Once A Day 7)  Xalatan 0.005 %  Soln (Latanoprost) .... At Bedtime 8)  Ranitidine Hcl 150 Mg Caps (Ranitidine Hcl) .Marland Kitchen.. 1 By Mouth Two Times A Day X 2 Weeks Then At Bedtime 9)  Cholestyramine 4 Gm Pack (Cholestyramine) .Marland Kitchen.. 1 Packet in Water Two Times A Day  Allergies  (verified): 1)  Actonel (Risedronate Sodium)  Past History:  Past Medical History: Last updated: 01/22/2007 Hyperlipidemia Hypertension Osteoporosis GLAUCOMA (ICD-365.9)  Past Surgical History: Last updated: 01/22/2007 Cataract extraction PSH reviewed for relevance, FH reviewed for relevance  Review of Systems  The patient denies anorexia, fever, weight loss, chest pain, dyspnea on exertion, peripheral edema, abdominal pain, hematochezia, incontinence, muscle weakness, transient blindness, difficulty walking, and enlarged lymph nodes.    Physical Exam  General:  Elderly AA woman in no distress Head:  normocephalic and atraumatic.   Eyes:  C&S clear Neck:  supple.   Lungs:  normal respiratory effort and normal breath sounds.   Heart:  normal rate and regular rhythm.   Abdomen:  BS postive, no guarding or rebound, no masses. Normal bowel sounds.  Neurologic:  alert & oriented X3, cranial nerves II-XII intact, and gait normal.   Skin:  turgor normal and color normal.   Psych:  Oriented X3, normally interactive, and good eye contact.     Impression & Recommendations:  Problem # 1:  LOOSE STOOLS (ICD-787.91) Patient is better on cholestyramine. she is a little fixated on her bowel habit. Negative exam.  Plan - continue cholestyramine           for continued symptoms may try IBS  meds.            D/C any contributing meds: lovastatin, Evista           Increase zantac use for eructation and upper bloating.  Complete Medication List: 1)  Enalapril-hydrochlorothiazide 10-25 Mg Tabs (Enalapril-hydrochlorothiazide) .... Take 1 tablet by mouth once a day 2)  Loratadine 10 Mg Tabs (Loratadine) .... Take 1 tablet by mouth once a day as needed 3)  Aleve 220 Mg Tabs (Naproxen sodium) .... As needed 4)  Tums 500 Mg Chew (Calcium carbonate antacid) .... As needed 5)  Xalatan 0.005 % Soln (Latanoprost) .... At bedtime 6)  Ranitidine Hcl 150 Mg Caps (Ranitidine hcl) .Marland Kitchen.. 1 by mouth two  times a day x 2 weeks then at bedtime 7)  Cholestyramine 4 Gm Pack (Cholestyramine) .Marland Kitchen.. 1 packet in water two times a day  Patient Instructions: 1)  Irregular bowel habit: it does sound like your stools have more form while taking the cholestyramine - continue to take twice a day. 2)  Stop the following medications - lovastatin (cholesterol medication - not really doing anything to help you at this time); Evista - may cause hot flashes and it is not going to help your bone density to any significant degree. 3)  For increased gas/belching - increase Ranitidine to morning and night. 4)  If you continue to have bowel problems and bloating of the lower abdomen after the above changes let me know and we can add a medicine for irritible bowel.    Orders Added: 1)  Est. Patient Level III [11914]

## 2010-03-10 NOTE — Progress Notes (Signed)
Summary: ABD problem  Phone Note Call from Patient Call back at Home Phone [phone]   Caller: [name] - Daughter Summary of Call: Patient c/o "everything she eats goes thru her" and constant abd discomfort. She has been on the new med less than a week. Should pt give it more time?  Initial call taken by: Lamar Sprinkles, CMA,  November 08, 2009 2:12 PM  Follow-up for Phone Call        tried to call - left msg ...will call tomorrow Follow-up by: Jacques Navy MD,  November 08, 2009 5:44 PM  Additional Follow-up for Phone Call Additional follow up Details #1::        tried to call dtr-no answer. Left message - please call triage during the day to report on Ms. Johnnette Litter if she is still having problems.  Additional Follow-up by: Jacques Navy MD,  November 09, 2009 6:57 PM    Additional Follow-up for Phone Call Additional follow up Details #2::    Pt's daughter called back, She is worried that her abd discomfort comes and goes. She has "attack" which consist of low abd pain, loose bowel movements and some tingling in her feet. Daughter would like MD to call pt to explain more. Please advise. Or call daughter [phone] - [name] Or other daughter [name] [phone].....................Marland KitchenLamar Sprinkles, CMA  November 10, 2009 2:29 PM   Additional Follow-up for Phone Call Additional follow up Details #3:: Details for Additional Follow-up Action Taken: spoke with the patient, [name] and [name]. bowel habi is irregular and she has mild pain. No blood or mucus in stool and no bile.  Plan  continue cholestyramine and H2 blocker          she will come for ROV for persiistent symptoms.  Additional Follow-up by: Jacques Navy MD,  November 10, 2009 2:51 PM

## 2010-03-10 NOTE — Progress Notes (Signed)
Summary: Stomach problems  Phone Note Call from Patient Call back at [phone]   Summary of Call: Pt c/o "stomach" problems. Spoke w/pt. C/o frequent stools after eating. Some nausea in the am and some stools are loose. She tried 1/2 dose of pepto and says this eased only a small amount.  Initial call taken by: Lamar Sprinkles, CMA,  October 01, 2009 5:24 PM  Follow-up for Phone Call        to regulate bowel habit I suggest metamucil or the equivalent one dose daily. Follow-up by: Jacques Navy MD,  October 01, 2009 5:29 PM  Additional Follow-up for Phone Call Additional follow up Details #1::        Pt informed, she asked that I call her daughter and inform her. Left detailed vm for [name] [phone] Additional Follow-up by: Lamar Sprinkles, CMA,  October 01, 2009 6:16 PM    New/Updated Medications: METAMUCIL 30.9 % POWD (PSYLLIUM) one dose once daily

## 2010-03-10 NOTE — Letter (Signed)
[facility] [address] [place], [place]  [address] Phone: [phone]      February 28, 2010   [facility] [address] [place], [place] [address]  RE:  LAB RESULTS  Dear  Melanie Clements,  The following is an interpretation of your most recent lab tests.  Please take note of any instructions provided or changes to medications that have resulted from your lab work.  ELECTROLYTES:  Good - no changes needed  KIDNEY FUNCTION TESTS:  Good - no changes needed  LIVER FUNCTION TESTS:  Good - no changes needed   DIABETIC STUDIES:  Good - no changes needed Blood Glucose: 76    CBC:  Good - no changes needed B12 normal  Lab work all looks normal.  Please come see me if you have any questions about these lab results.   Sincerely Yours,    Jacques Navy MD  Patient: Melanie Clements Note: All result statuses are Final unless otherwise noted.  Tests: (1) BMP (METABOL)   Sodium                    143 mEq/L                   135-145   Potassium                 4.2 mEq/L                   3.5-5.1   Chloride                  112 mEq/L                   96-112   Carbon Dioxide            22 mEq/L                    19-32   Glucose                   76 mg/dL                    62-95   BUN                  [H]  28 mg/dL                    2-84   Creatinine           [H]  1.5 mg/dL                   1.3-2.4   Calcium                   9.3 mg/dL                   4.0-10.2   GFR                  [L]  42.56 mL/min                >60.00  Tests: (2) CBC Platelet w/Diff (CBCD)   White Cell Count          7.2 K/uL                    4.5-10.5   Red Cell Count  3.89 Mil/uL                 3.87-5.11   Hemoglobin           [L]  11.1 g/dL                   54.0-98.1   Hematocrit           [L]  34.5 %                      36.0-46.0   MCV                       88.9 fl                     78.0-100.0   MCHC                      32.2 g/dL                    19.1-47.8   RDW                  [H]  14.9 %                      11.5-14.6   Platelet Count            228.0 K/uL                  150.0-400.0   Neutrophil %              62.7 %                      43.0-77.0   Lymphocyte %              30.2 %                      12.0-46.0   Monocyte %                6.1 %                       3.0-12.0   Eosinophils%              0.9 %                       0.0-5.0   Basophils %               0.1 %                       0.0-3.0   Neutrophill Absolute      4.5 K/uL                    1.4-7.7   Lymphocyte Absolute       2.2 K/uL                    0.7-4.0   Monocyte Absolute         0.4 K/uL                    0.1-1.0  Eosinophils, Absolute  0.1 K/uL                    0.0-0.7   Basophils Absolute        0.0 K/uL                    0.0-0.1  Tests: (3) B12 + Folate Panel (B12/FOL)   Vitamin B12               237 pg/mL                   211-911   Folate                    11.4 ng/mL     Deficient  0.4 - 3.4 ng/mL     Indeterminate  3.4 - 5.4 ng/mL     Normal  >5.4 ng/mL  Tests: (4) IBC Panel (IBC)   Iron                      87 ug/dL                    82-956   Transferrin               263.0 mg/dL                 213.0-865.7   Iron Saturation           23.6 %                      20.0-50.0

## 2010-03-17 ENCOUNTER — Inpatient Hospital Stay (HOSPITAL_COMMUNITY)
Admission: AD | Admit: 2010-03-17 | Discharge: 2010-03-21 | DRG: 378 | Disposition: A | Discharge status: Home / Self Care | Payer: Medicare Other | Source: Ambulatory Visit | Attending: Internal Medicine | Admitting: Internal Medicine

## 2010-03-17 ENCOUNTER — Ambulatory Visit (INDEPENDENT_AMBULATORY_CARE_PROVIDER_SITE_OTHER): Payer: Medicare Other | Admitting: Internal Medicine

## 2010-03-17 ENCOUNTER — Observation Stay (HOSPITAL_COMMUNITY): Payer: Medicare Other

## 2010-03-17 ENCOUNTER — Encounter: Payer: Self-pay | Admitting: Internal Medicine

## 2010-03-17 DIAGNOSIS — R911 Solitary pulmonary nodule: Secondary | ICD-10-CM | POA: Diagnosis present

## 2010-03-17 DIAGNOSIS — K2980 Duodenitis without bleeding: Secondary | ICD-10-CM | POA: Diagnosis present

## 2010-03-17 DIAGNOSIS — K315 Obstruction of duodenum: Secondary | ICD-10-CM | POA: Diagnosis present

## 2010-03-17 DIAGNOSIS — K922 Gastrointestinal hemorrhage, unspecified: Principal | ICD-10-CM | POA: Diagnosis present

## 2010-03-17 DIAGNOSIS — D62 Acute posthemorrhagic anemia: Secondary | ICD-10-CM | POA: Diagnosis present

## 2010-03-17 DIAGNOSIS — D509 Iron deficiency anemia, unspecified: Secondary | ICD-10-CM | POA: Diagnosis present

## 2010-03-17 DIAGNOSIS — H409 Unspecified glaucoma: Secondary | ICD-10-CM | POA: Diagnosis present

## 2010-03-17 DIAGNOSIS — K5900 Constipation, unspecified: Secondary | ICD-10-CM | POA: Diagnosis present

## 2010-03-17 DIAGNOSIS — I10 Essential (primary) hypertension: Secondary | ICD-10-CM | POA: Diagnosis present

## 2010-03-17 DIAGNOSIS — K5710 Diverticulosis of small intestine without perforation or abscess without bleeding: Secondary | ICD-10-CM | POA: Diagnosis present

## 2010-03-17 LAB — DIFFERENTIAL
Eosinophils Relative: 0 % (ref 0–5)
Lymphocytes Relative: 21 % (ref 12–46)
Neutro Abs: 5.8 10*3/uL (ref 1.7–7.7)
Neutrophils Relative %: 74 % (ref 43–77)

## 2010-03-17 LAB — CBC
HCT: 32.2 % — ABNORMAL LOW (ref 36.0–46.0)
MCHC: 31.7 g/dL (ref 30.0–36.0)
RDW: 14.1 % (ref 11.5–15.5)
WBC: 7.9 10*3/uL (ref 4.0–10.5)

## 2010-03-17 LAB — COMPREHENSIVE METABOLIC PANEL
ALT: 9 U/L (ref 0–35)
CO2: 25 mEq/L (ref 19–32)
Calcium: 9.7 mg/dL (ref 8.4–10.5)
Chloride: 112 mEq/L (ref 96–112)
GFR calc non Af Amer: 41 mL/min — ABNORMAL LOW (ref >60)
Glucose, Bld: 94 mg/dL (ref 70–99)
Sodium: 144 mEq/L (ref 135–145)
Total Bilirubin: 0.6 mg/dL (ref 0.3–1.2)

## 2010-03-17 LAB — PROTIME-INR: Prothrombin Time: 14.4 seconds (ref 11.6–15.2)

## 2010-03-18 LAB — HEMOGLOBIN AND HEMATOCRIT, BLOOD
HCT: 29.9 % — ABNORMAL LOW (ref 36.0–46.0)
HCT: 30.6 % — ABNORMAL LOW (ref 36.0–46.0)
Hemoglobin: 9.3 g/dL — ABNORMAL LOW (ref 12.0–15.0)

## 2010-03-19 ENCOUNTER — Encounter: Payer: Self-pay | Admitting: Internal Medicine

## 2010-03-19 ENCOUNTER — Other Ambulatory Visit: Payer: Self-pay | Admitting: Internal Medicine

## 2010-03-19 DIAGNOSIS — K2980 Duodenitis without bleeding: Secondary | ICD-10-CM

## 2010-03-19 DIAGNOSIS — D62 Acute posthemorrhagic anemia: Secondary | ICD-10-CM

## 2010-03-19 DIAGNOSIS — K315 Obstruction of duodenum: Secondary | ICD-10-CM

## 2010-03-19 DIAGNOSIS — K921 Melena: Secondary | ICD-10-CM

## 2010-03-19 DIAGNOSIS — K922 Gastrointestinal hemorrhage, unspecified: Secondary | ICD-10-CM

## 2010-03-19 LAB — CBC
HCT: 34 % — ABNORMAL LOW (ref 36.0–46.0)
Hemoglobin: 11 g/dL — ABNORMAL LOW (ref 12.0–15.0)
MCH: 29 pg (ref 26.0–34.0)
MCHC: 32.4 g/dL (ref 30.0–36.0)
MCV: 89.7 fL (ref 78.0–100.0)
Platelets: 196 10*3/uL (ref 150–400)
RBC: 3.79 MIL/uL — ABNORMAL LOW (ref 3.87–5.11)
RDW: 14 % (ref 11.5–15.5)
WBC: 8 10*3/uL (ref 4.0–10.5)

## 2010-03-19 LAB — HEMOGLOBIN AND HEMATOCRIT, BLOOD
HCT: 27.9 % — ABNORMAL LOW (ref 36.0–46.0)
Hemoglobin: 8.8 g/dL — ABNORMAL LOW (ref 12.0–15.0)

## 2010-03-19 LAB — PREPARE RBC (CROSSMATCH)

## 2010-03-20 LAB — CBC
Platelets: 190 10*3/uL (ref 150–400)
RBC: 3.48 MIL/uL — ABNORMAL LOW (ref 3.87–5.11)
RDW: 14 % (ref 11.5–15.5)
WBC: 6.7 10*3/uL (ref 4.0–10.5)

## 2010-03-20 LAB — HEMOGLOBIN AND HEMATOCRIT, BLOOD: Hemoglobin: 11.2 g/dL — ABNORMAL LOW (ref 12.0–15.0)

## 2010-03-21 LAB — TYPE AND SCREEN
ABO/RH(D): O NEG
Unit division: 0

## 2010-03-21 LAB — HEMOGLOBIN AND HEMATOCRIT, BLOOD
HCT: 31.6 % — ABNORMAL LOW (ref 36.0–46.0)
Hemoglobin: 10.1 g/dL — ABNORMAL LOW (ref 12.0–15.0)

## 2010-03-23 LAB — GASTRIN: Gastrin: 301 pg/mL — ABNORMAL HIGH (ref <101)

## 2010-03-24 NOTE — Initial Assessments (Signed)
Summary: BLOOD IN STOOL/ NWS   Vital Signs:  Patient profile:   77 year old female Height:      65 inches Weight:      118.75 pounds O2 Sat:      97 % on Room air Temp:     98.5 degrees F oral Pulse rate:   99 / minute BP sitting:   148 / 78  (left arm) Cuff size:   regular  Vitals Entered By: Rock Nephew CMA (March 17, 2010 1:25 PM)  O2 Flow:  Room air CC: Patient c/o loose stools with blood x 03/15/10 Is Patient Diabetic? No Pain Assessment Patient in pain? no        Primary Care Provider:  Draylen Lobue  CC:  Patient c/o loose stools with blood x 03/15/10.  History of Present Illness: Melanie Clements presents for feeling ill: starting Sunday, Monday she had loss of appetite. That night she had an abnormal appearing stool. Tuesday she had a very dark stool with a pink rim around the commode. Wednsday she continued to feel weak, with black, current jelly stools. She is have malodorous stools. She also has  been having early satiety. No fever, no chills, no shortness of breath. She reports that she has passed dark stool/blood with sneezing and spontaneously. She has been on cholestyramine for chronic loose stools. She does take aleve every other day.   Current Medications (verified): 1)  Enalapril-Hydrochlorothiazide 10-25 Mg Tabs (Enalapril-Hydrochlorothiazide) .... Take 1 Tablet By Mouth Once A Day 2)  Loratadine 10 Mg  Tabs (Loratadine) .... Take 1 Tablet By Mouth Once A Day As Needed 3)  Aleve 220 Mg  Tabs (Naproxen Sodium) .... As Needed 4)  Tums 500 Mg  Chew (Calcium Carbonate Antacid) .... As Needed 5)  Xalatan 0.005 %  Soln (Latanoprost) .... At Bedtime 6)  Ranitidine Hcl 150 Mg Caps (Ranitidine Hcl) .Marland Kitchen.. 1 By Mouth Two Times A Day X 2 Weeks Then At Bedtime 7)  Cholestyramine 4 Gm Pack (Cholestyramine) .Marland Kitchen.. 1 Packet in Water Two Times A Day 8)  Promethazine-Codeine 6.25-10 Mg/37ml Syrp (Promethazine-Codeine) .Marland Kitchen.. 1 Tsp Every 6 Hours As Needed Cough  Allergies  (verified): 1)  Actonel (Risedronate Sodium)  Past History:  Past Medical History: Last updated: 01/22/2007 Hyperlipidemia Hypertension Osteoporosis GLAUCOMA (ICD-365.9)  Past Surgical History: Last updated: 01/22/2007 Cataract extraction  Family History: Last updated: 08/01/2007 non-contributory at patinets age.  Social History: Last updated: 07/27/2009 has a very supportive family. remains I-ADLs except for driving. End of Life - Care: discussed with patinet and dtr: DNR. Provided end of life materials, out of facility order and MOST form.  Review of Systems       The patient complains of anorexia, abdominal pain, melena, hematochezia, severe indigestion/heartburn, and angioedema.  The patient denies fever, weight loss, weight gain, decreased hearing, hoarseness, chest pain, syncope, dyspnea on exertion, prolonged cough, headaches, muscle weakness, difficulty walking, depression, and enlarged lymph nodes.    Physical Exam  General:  Very elderly AA woman who is uncomfortable but not in distress. Head:  normocephalic and atraumatic.   Eyes:  arcus senilis. C&S muddy. Vision intact Ears:  R ear normal and no external deformities.   Nose:  no external deformity and no external erythema.   Mouth:  arched palate, no oral lesions. Neck:  no masses, no thyromegaly, and no carotid bruits.   Chest Wall:  no deformities.   Breasts:  deferred Lungs:  normal respiratory effort, normal breath sounds, no crackles,  and no wheezes.   Heart:  normal rate, regular rhythm, and no murmur.   Abdomen:  soft, normal bowel sounds, no masses, and no guarding.  Tender to palpation in the epigastrum and RUQ. Rectal:  deferred. Patient brough toilet tissue with dark stool and blood.  Msk:  normal ROM, no joint tenderness, and no redness over joints.   Pulses:  2+ radial pulses Neurologic:  alert & oriented X3 and cranial nerves II-XII intact.   Skin:  turgor normal, color normal, no rashes, no  ecchymoses, no petechiae, and no ulcerations.   Cervical Nodes:  no anterior cervical adenopathy and no posterior cervical adenopathy.   Psych:  Oriented X3, memory intact for recent and remote, normally interactive, good eye contact, and not anxious appearing.     Impression & Recommendations:  Problem # 1:  UPPER GASTROINTESTINAL HEMORRHAGE, ACUTE (ICD-578.9)  Patient with epigastric tenderness and melena and hematochezia - current jelly- c/w UGI bleed. She has been on low dose NSAIDs. She is hemodynamically stable.   Plan - 24 hr observation [facility]           lab - CBC, CMet, 12 lead EKG           imaging - acute abdominal series           type and hold for possible transfusion           Protonix 40mg  IV q 12 - first dose stat.  Orders: No Charge Patient Arrived (NCPA0) (NCPA0)  Problem # 2:  CONSTIPATION (ICD-564.00) intermittent problem. Will hold cholestyramine at this time  Problem # 3:  PULMONARY NODULE, RIGHT UPPER LOBE (ICD-518.89) stabel without change  Problem # 4:  HYPERTENSION (ICD-401.9)  Her updated medication list for this problem includes:    Enalapril-hydrochlorothiazide 10-25 Mg Tabs (Enalapril-hydrochlorothiazide) .Marland Kitchen... Take 1 tablet by mouth once a day  BP today: 148/78 Prior BP: 136/76 (02/17/2010)  Labs Reviewed: K+: 4.2 (02/17/2010) Creat: : 1.5 (02/17/2010)   Chol: 197 (07/27/2009)   HDL: 52.10 (07/27/2009)   LDL: 111 (07/27/2009)   TG: 172.0 (07/27/2009)  Stable BP. will continue home meds  Problem # 5:  Code status Discussed with patinet and her daughter: DNR/DNI, ok for pressors, antiarrythmic drugs.   Complete Medication List: 1)  Enalapril-hydrochlorothiazide 10-25 Mg Tabs (Enalapril-hydrochlorothiazide) .... Take 1 tablet by mouth once a day 2)  Loratadine 10 Mg Tabs (Loratadine) .... Take 1 tablet by mouth once a day as needed 3)  Aleve 220 Mg Tabs (Naproxen sodium) .... As needed 4)  Tums 500 Mg Chew (Calcium carbonate antacid) .... As  needed 5)  Xalatan 0.005 % Soln (Latanoprost) .... At bedtime 6)  Ranitidine Hcl 150 Mg Caps (Ranitidine hcl) .Marland Kitchen.. 1 by mouth two times a day x 2 weeks then at bedtime 7)  Cholestyramine 4 Gm Pack (Cholestyramine) .Marland Kitchen.. 1 packet in water two times a day 8)  Promethazine-codeine 6.25-10 Mg/51ml Syrp (Promethazine-codeine) .Marland Kitchen.. 1 tsp every 6 hours as needed cough   Orders Added: 1)  No Charge Patient Arrived (NCPA0) [NCPA0]

## 2010-03-28 ENCOUNTER — Other Ambulatory Visit: Payer: Medicare Other

## 2010-03-28 ENCOUNTER — Encounter: Payer: Self-pay | Admitting: Internal Medicine

## 2010-03-28 ENCOUNTER — Encounter (INDEPENDENT_AMBULATORY_CARE_PROVIDER_SITE_OTHER): Payer: Self-pay | Admitting: *Deleted

## 2010-03-28 ENCOUNTER — Other Ambulatory Visit: Payer: Self-pay | Admitting: Internal Medicine

## 2010-03-28 ENCOUNTER — Ambulatory Visit (INDEPENDENT_AMBULATORY_CARE_PROVIDER_SITE_OTHER): Payer: Medicare Other | Admitting: Internal Medicine

## 2010-03-28 DIAGNOSIS — K922 Gastrointestinal hemorrhage, unspecified: Secondary | ICD-10-CM

## 2010-03-28 LAB — HEMATOCRIT: HCT: 35.1 % — ABNORMAL LOW (ref 36.0–46.0)

## 2010-03-29 ENCOUNTER — Telehealth: Payer: Self-pay | Admitting: Internal Medicine

## 2010-03-30 NOTE — Discharge Summary (Signed)
NAME:  Melanie Clements, Melanie Clements             ACCOUNT NO.:  [id number]  MEDICAL RECORD NO.:  [id number]           PATIENT TYPE:  I  LOCATION:  1317                         FACILITY:  [facility]  PHYSICIAN:  Rosalyn Gess. Zafira Munos, MD  DATE OF BIRTH:  02-01-1918  DATE OF ADMISSION:  03/17/2010 DATE OF DISCHARGE:  03/21/2010                              DISCHARGE SUMMARY   ADMITTING DIAGNOSES: 1. Upper gastrointestinal bleed. 2. Constipation. 3. Pulmonary nodule, right upper lobe. 4. Hypertension.  DISCHARGE DIAGNOSES: 1. Upper gastrointestinal bleed. 2. Constipation. 3. Pulmonary nodule, right upper lobe. 4. Hypertension.  CONSULTANTS:  Hedwig Morton. Juanda Chance, MD, for Gastroenterology.  PROCEDURES: 1. Acute abdominal series performed on the day of admission, which     showed cardiomegaly without edema, unchanged nodule in the right     lower lobe, gaseous distention of the stomach. 2. EGD by Dr. Lina Sar, performed on March 19, 2010, which found     duodenitis in the bulb and descending duodenum with     pseudodiverticulum of the duodenal bulb and mild stenosis of the     duodenal outlet with acute edema.  No definite ulcer was seen.  No     blood in the duodenum was noted.  HISTORY OF PRESENT ILLNESS:  Melanie Clements is a 77 year old African American woman who presented to the office on the day of admission.  She started feeling ill on the "Sunday prior to admission and the Monday prior to admission, she had loss of appetite.  On that Monday night, she had abnormal-appearing stool and Tuesday she had very dark stool with pink remnant on the commode.  She continued to feel weak and did describe her stools as black current, jelly type in nature.  She reported there is also a very strong and abnormal odor.  The patient also has been complaining of early satiety.  She had no fevers or chills.  No shortness of breath.  The patient reports she had passed some dark stool with blood, with sneezing and  spontaneously.  The patient does have a history of duodenal ulcer in 2008.  For this reason, the patient was admitted to the hospital with probable upper GI bleed. For details of the past medical history, family history, social history, and admission exam, please see the EMR generated admit note.  HOSPITAL COURSE: 1. GI bleed.  The patient had a hemoglobin of 11.1 g in January and at     the time of admission, her hemoglobin was down to 10.2.  The     patient's hemoglobin continued to drop to a low 8.8 g on February     11" , 2012, despite being on proton pump inhibitor b.i.d. by vein.     She was at that point seen by Dr. Juanda Chance of Gastroenterology.  The     patient also was transfused 1 unit of packed red cells.  The     patient had an upper GI study/EGD as noted.  Her hemoglobin came up     to 11 g initially and then settled at 10 g with the last 3 readings     being 11.2  g on March 20, 2010, and 10.1 on the morning of     March 20, 2010, and 10.1 on the evening of March 20, 2010.     The patient had no signs of active bleeding.  With the patient's     hemoglobin having stabilized, with no sign of ulcer on EGD, with     the patient having a return of the appetite, she was thought to be     stable and ready for discharge home. 2. Hypertension.  The patient's blood pressure remained stable during     her hospitalization.  She was taken off hydrochlorothiazide because     of her advanced age and was switched to furosemide instead and will     continue this as an outpatient.  DISCHARGE EXAMINATION:  VITAL SIGNS:  At 1400 hours, the patient's temperature was 98, blood pressure 133/79, heart rate 86, respirations 16, O2 saturations  96% on room air. GENERAL APPEARANCE:  This is a well-preserved 77 year old woman sitting on the side of the bed, eating dinner. LUNGS:  Clear. HEART:  Rate was regular. ABDOMEN:  Nontender.  No further exam conducted.  FINAL LABORATORY:  Hemoglobins  as noted above.  DISPOSITION:  The patient is discharged home.  She can resume a regular diet.  Her medications will be as prior to admission except hydrochlorothiazide is being changed to furosemide and the patient will be treated with a proton pump inhibitor b.i.d. until seen at followup in 1 week.  The patient will be seen in the office and have a followup hemoglobin and hematocrit.  She will call in the interval if she has any acute problems.  DISCHARGE CONDITION:  The patient's condition at the time of discharge dictation is stable and improved.     Rosalyn Gess Roseann Kees, MD     MEN/MEDQ  D:  03/21/2010  T:  03/22/2010  Job:  [id number]  cc:   Hedwig Morton. Juanda Chance, MD [address] [place] [address]  Electronically Signed by Illene Regulus MD on 03/30/2010 06:08:45 AM

## 2010-03-30 NOTE — Procedures (Signed)
Summary: Upper Endoscopy  Patient: Melanie Clements Note: All result statuses are Final unless otherwise noted.  Tests: (1) Upper Endoscopy (EGD)   EGD Upper Endoscopy       DONE     [facility]     [address] [place], [place]  [address]           ENDOSCOPY PROCEDURE REPORT           PATIENT:  Melanie, Clements  MR#:  [id number]     BIRTHDATE:  30-Dec-1917, 89 yrs. old  GENDER:  female           ENDOSCOPIST:  Hedwig Morton. Juanda Chance, MD     Referred by:  Rosalyn Gess. Norins, M.D.           PROCEDURE DATE:  03/19/2010     PROCEDURE:  EGD with biopsy, [id number]     ASA CLASS:  Class III     INDICATIONS:  melena, anemia hx of duodenal ulcer and DOO     bstruction on prior endoscopy           MEDICATIONS:   Versed 3 mg, Fentanyl 25 mg     TOPICAL ANESTHETIC:  Cetacaine Spray           DESCRIPTION OF PROCEDURE:   After the risks and benefits of the     procedure were explained, informed consent was obtained.  The     Pentax Gastroscope [id number] endoscope was introduced through the     mouth and advanced to the second portion of the duodenum.  The     instrument was slowly withdrawn as the mucosa was fully examined.     <<PROCEDUREIMAGES>>           Duodenitis was found in the bulb and descending duodenum.     psedodiverticulum of the duodenal bulb  stenosis (see image2,     image1, image3, image5, image6, and image7). mild stenosis of the     duodenal outlet with acute edema, no definite ulcer, no blood in     the duodenum  Otherwise normal stomach. With standard forceps, a     biopsy was obtained and sent to pathology (see image8, image2, and     image1).    Retroflexed views revealed no abnormalities.    The     scope was then withdrawn from the patient and the procedure     completed.           COMPLICATIONS:  None           ENDOSCOPIC IMPRESSION:     1) Duodenitis in the bulb/descending duodenum     2) Stenosis     3) Otherwise normal stomach     no active bleeding,  s/p UGIB from duodenitis, DOO by a benign     process     RECOMMENDATIONS:     1) Await biopsy results     PPI, advance diet, watch for rebleeding, hold Aleve           ______________________________     Hedwig Morton. Juanda Chance, MD           CC:           n.     eSIGNED:   Hedwig Morton. Sahmir Weatherbee at 03/19/2010 12:24 PM           Shelby Dubin, [id number]  Note: An exclamation mark (!) indicates a result that was not dispersed into the flowsheet. Document  Creation Date: 03/21/2010 9:17 AM _______________________________________________________________________  (1) Order result status: Final Collection or observation date-time: 03/19/2010 12:13 Requested date-time:  Receipt date-time:  Reported date-time:  Referring Physician:   Ordering Physician: Lina Sar [phone]) Specimen Source:  Source: Launa Grill Order Number: [id number] Lab site:

## 2010-04-05 NOTE — Progress Notes (Signed)
  Phone Note Outgoing Call   Reason for Call: Discuss lab or test results Summary of Call: please call patient: hgb is normal.  Thanks Initial call taken by: Jacques Navy MD,  March 29, 2010 9:45 PM  Follow-up for Phone Call        lmoam for pt to call back Follow-up by: Ami Bullins CMA,  March 30, 2010 2:35 PM  Additional Follow-up for Phone Call Additional follow up Details #1::        pt informed per daughter Additional Follow-up by: Ami Bullins CMA,  March 31, 2010 11:32 AM

## 2010-04-05 NOTE — Assessment & Plan Note (Signed)
Summary: Hospital F/u/SD   Vital Signs:  Patient profile:   77 year old female Height:      65 inches (165.10 cm) Weight:      119.50 pounds (54.32 kg) BMI:     19.96 O2 Sat:      96 % on Room air Temp:     98.0 degrees F (36.67 degrees C) oral Pulse rate:   90 / minute Resp:     14 per minute BP sitting:   130 / 70  (left arm) Cuff size:   regular  Vitals Entered By: Burnard Leigh CMA(AAMA) (March 28, 2010 11:27 AM)  O2 Flow:  Room air CC: Pt here for hospital F/U OV/sls,cma Is Patient Diabetic? No   Primary Care Provider:  Jami Bogdanski  CC:  Pt here for hospital F/U OV/sls and cma.  History of Present Illness: Patient presents for hospital follow-up. She was admitted with UGI bleed. By EGD she had duodenitis and gastritis without ulcer. She did require a blood transfusion. Since being home she has been doing well with no recurrent dark or black stools. She has had no stomach pain and has had a good appetite.  she has been taking protonix two times a day since d/c.   Current Medications (verified): 1)  Enalapril-Hydrochlorothiazide 10-25 Mg Tabs (Enalapril-Hydrochlorothiazide) .... Take 1 Tablet By Mouth Once A Day 2)  Loratadine 10 Mg  Tabs (Loratadine) .... Take 1 Tablet By Mouth Once A Day As Needed 3)  Tums 500 Mg  Chew (Calcium Carbonate Antacid) .... As Needed 4)  Travatan Z 0.004 % Soln (Travoprost) .Marland Kitchen.. 1 Drop To Each Eye Qhs 5)  Pantoprazole Sodium 40 Mg Tbec (Pantoprazole Sodium) .... Take 1 Once Daily 6)  Cholestyramine 4 Gm Pack (Cholestyramine) .Marland Kitchen.. 1 Packet in Water Two Times A Day 7)  Promethazine-Codeine 6.25-10 Mg/23ml Syrp (Promethazine-Codeine) .Marland Kitchen.. 1 Tsp Every 6 Hours As Needed Cough 8)  Furosemide 20 Mg Tabs (Furosemide) .... Take 1 By Mouth Once Daily  Allergies (verified): 1)  Actonel (Risedronate Sodium)  Past History:  Past Medical History: UPPER GASTROINTESTINAL HEMORRHAGE, ACUTE (ICD-578.9) STIFFNESS OF JOINT NEC LOWER LEG  (ICD-719.56) CONSTIPATION (ICD-564.00) WEAKNESS (ICD-780.79) DYSPEPSIA, CHRONIC (ICD-536.8) PULMONARY NODULE, RIGHT UPPER LOBE (ICD-518.89) ANEMIA (ICD-285.9) OSTEOPOROSIS (ICD-733.00) GLAUCOMA (ICD-365.9) HYPERTENSION (ICD-401.9) HYPERLIPIDEMIA (ICD-272.4)  Past Surgical History: Reviewed history from 01/22/2007 and no changes required. Cataract extraction PMH-FH-SH reviewed-no changes except otherwise noted  Family History: Reviewed history from 08/01/2007 and no changes required. non-contributory at patinets age.  Social History: Reviewed history from 07/27/2009 and no changes required. has a very supportive family. remains I-ADLs except for driving. End of Life - Care: discussed with patinet and dtr: DNR. Provided end of life materials, out of facility order and MOST form.  Review of Systems  The patient denies anorexia, fever, weight loss, weight gain, decreased hearing, hoarseness, chest pain, dyspnea on exertion, prolonged cough, hemoptysis, abdominal pain, hematochezia, incontinence, muscle weakness, difficulty walking, unusual weight change, abnormal bleeding, and angioedema.    Physical Exam  General:  alert, well-developed, well-nourished, well-hydrated, and normal appearance.   Head:  normocephalic and atraumatic.   Neck:  supple and full ROM.   Lungs:  normal respiratory effort and normal breath sounds.   Heart:  normal rate and regular rhythm.   Abdomen:  soft, non-tender, normal bowel sounds, no guarding, and no rigidity.   Msk:  normal ROM.  tender to palpation at the knees Pulses:  2+ radial pulses Neurologic:  alert & oriented X3, cranial  nerves II-XII intact, and gait normal.     Impression & Recommendations:  Problem # 1:  UPPER GASTROINTESTINAL HEMORRHAGE, ACUTE (ICD-578.9) Patient has made a good recovery from Upper GI bleed and appears stable with return of normal appetite.  Plan - follow-up H/H  Orders: TLB-Hematocrit (Hct)  (85013-HCT) TLB-Hemoglobin (Hgb) (85018-HGB)  Complete Medication List: 1)  Enalapril Maleate 10 Mg Tabs (Enalapril maleate) .Marland Kitchen.. 1 by mouth once daily 2)  Loratadine 10 Mg Tabs (Loratadine) .... Take 1 tablet by mouth once a day as needed 3)  Tums 500 Mg Chew (Calcium carbonate antacid) .... As needed 4)  Travatan Z 0.004 % Soln (Travoprost) .Marland Kitchen.. 1 drop to each eye qhs 5)  Pantoprazole Sodium 40 Mg Tbec (Pantoprazole sodium) .... Take 1 once daily 6)  Cholestyramine 4 Gm Pack (Cholestyramine) .Marland Kitchen.. 1 packet in water two times a day 7)  Promethazine-codeine 6.25-10 Mg/24ml Syrp (Promethazine-codeine) .Marland Kitchen.. 1 tsp every 6 hours as needed cough 8)  Furosemide 20 Mg Tabs (Furosemide) .... Take 1 by mouth once daily   Tests: (1) Hematocrit (HCT)   Hematocrit           [L]  35.1 %                      36.0-46.0  Tests: (2) Hemoglobin (HGB)   Hemoglobin           [L]  11.5 g/dL                   16.1-09.6   Patient Instructions: 1)  Resolving upper gastrointestinal bleed - appear to be doing very well. Plan - reduce protonix to just one in the AM for 3 weeks. Then, stop protonix and start taking over the counter ranitidine 150mg  once a day. Will check hemoglobin today. 2)  Arthritis - May NOT use NSAIDs (aleve, motrin, advil, etc.). May use tylenol 500mg  1 or 2 tablets 3 times a day on a regular basis to control the knee pain.  3)  Blood pressure - doing very well 130/70. Will change medication: enalapril/hct-  when you have finished your current supply will become enalapril 10 mg as a single agent. Continue taking furosemide 20mg  once a day.  Prescriptions: ENALAPRIL MALEATE 10 MG TABS (ENALAPRIL MALEATE) 1 by mouth once daily  #30 x 12   Entered and Authorized by:   Jacques Navy MD   Signed by:   Jacques Navy MD on 03/28/2010   Method used:   Electronically to        [facility] [phone]* (retail)       [address]       [place], [place]  [address]       Ph: [phone] or  [phone]       Fax: [phone]   RxID:   [id number]    Orders Added: 1)  TLB-Hematocrit (Hct) [85013-HCT] 2)  TLB-Hemoglobin (Hgb) [85018-HGB] 3)  Est. Patient Level III [34742]

## 2010-04-14 ENCOUNTER — Telehealth: Payer: Self-pay | Admitting: Internal Medicine

## 2010-04-19 NOTE — Procedures (Signed)
Summary: Endoscopy / [facility]  Endoscopy / [facility]   Imported By: Lennie Odor 04/12/2010 11:57:55  _____________________________________________________________________  External Attachment:    Type:   Image     Comment:   External Document

## 2010-04-19 NOTE — Progress Notes (Signed)
Summary: Protonix  Phone Note Call from Patient   Caller: Daughter - Claris Gladden Summary of Call: Pt stopped protonix and resumed raniditine this tuesday as directed. Pt c/o loose frequent stools and abd discomfort. These symptoms have caused her to have decreased appetite. Seems that protonix helped better, can pt resume?   Call pt's daughters Claris Gladden at [phone] OR [name] [phone] Initial call taken by: Lamar Sprinkles, CMA,  April 14, 2010 10:56 AM  Follow-up for Phone Call        ok to resume protonix 40mg  qAM. Please up date med list Follow-up by: Jacques Navy MD,  April 14, 2010 1:48 PM  Additional Follow-up for Phone Call Additional follow up Details #1::        Daughter [name] informed Additional Follow-up by: Lamar Sprinkles, CMA,  April 14, 2010 3:21 PM    New/Updated Medications: PROTONIX 40 MG TBEC (PANTOPRAZOLE SODIUM) 1 every morning Prescriptions: PROTONIX 40 MG TBEC (PANTOPRAZOLE SODIUM) 1 every morning  #90 x 1   Entered by:   Lamar Sprinkles, CMA   Authorized by:   Jacques Navy MD   Signed by:   Lamar Sprinkles, CMA on 04/14/2010   Method used:   Electronically to        [id number] (retail)       [address]       [place], [place]  [address]       Ph: [phone] or [phone]       Fax: [phone]   RxID:   [id number]

## 2010-05-06 ENCOUNTER — Other Ambulatory Visit: Payer: Self-pay | Admitting: Internal Medicine

## 2010-05-24 LAB — GLUCOSE, CAPILLARY: Glucose-Capillary: 92 mg/dL (ref 70–99)

## 2010-06-06 ENCOUNTER — Other Ambulatory Visit: Payer: Self-pay | Admitting: Internal Medicine

## 2010-06-09 ENCOUNTER — Telehealth: Payer: Self-pay | Admitting: *Deleted

## 2010-06-09 NOTE — Telephone Encounter (Signed)
Pt's daughter left VM - She has noticed increase in swelling in pt's feet and ankles. Med list from EMR shows pt is taking furosemide 20 mg 1 qd. Left gail VM to call office back tomorrow ? SOB, pain, or any other symptoms. Also to inform if pt is taking furosemide as written in chart.

## 2010-06-10 NOTE — Telephone Encounter (Signed)
Spoke w/daughter and informed of MDs instructions. Informed that Pt can use light colored or clear liquids for help w/nausea & to avoid dairy products.  Informed of Sat clinic if needed for any worsening Sxs --SOB and/or swelling.

## 2010-06-10 NOTE — Telephone Encounter (Signed)
Today pt c/o nausea and jittery. Per daughter pt denies any pain but states at times she seems short of breath.  She is taking furosemide daily. Please advise any changes for pt.

## 2010-06-10 NOTE — Telephone Encounter (Signed)
Symptoms suggest increase fluid retention. Plan - increase lasix to 40mg  (2 x 20mg ) daily for three days and then resume 20mg  once a day. For continued swelling and/or SOB will need OV.

## 2010-06-13 ENCOUNTER — Encounter: Payer: Self-pay | Admitting: Internal Medicine

## 2010-06-14 ENCOUNTER — Other Ambulatory Visit: Payer: Medicare Other

## 2010-06-14 ENCOUNTER — Ambulatory Visit (INDEPENDENT_AMBULATORY_CARE_PROVIDER_SITE_OTHER): Payer: Medicare Other | Admitting: Internal Medicine

## 2010-06-14 VITALS — BP 134/82 | HR 93 | Temp 97.6°F | Wt 126.0 lb

## 2010-06-14 DIAGNOSIS — Z136 Encounter for screening for cardiovascular disorders: Secondary | ICD-10-CM

## 2010-06-14 DIAGNOSIS — R0789 Other chest pain: Secondary | ICD-10-CM

## 2010-06-14 NOTE — Patient Instructions (Signed)
Episode of nausea and chest pressure is suggestive of a possible mild heart event. Plan - will check some lab work today looking for potential heart damage. Will schedule a 2 dimensional echo-cardiogram to evaluate heart function. Please continue all your medications and add a baby aspirin, 81 mg daily. The symptoms last Friday are not depression related.  For any increase in shortness of breath, for any chest pressure you need to call!!

## 2010-06-15 ENCOUNTER — Other Ambulatory Visit (HOSPITAL_COMMUNITY): Payer: Self-pay | Admitting: Internal Medicine

## 2010-06-15 DIAGNOSIS — R0789 Other chest pain: Secondary | ICD-10-CM

## 2010-06-15 NOTE — Progress Notes (Signed)
  Subjective:    Patient ID: Melanie Clements, female    DOB: October 26, 1917, 77 y.o.   MRN: [id number]  HPI [name] presents after experiencing an episode of sudden on-set nausea, chest pressure and mild diaphoresis with minimal SOB last Friday, May 4th. Her symptoms lasted less than 30 minutes. She had no frank hard chest pain. Her daughter was present and gave her some coca-cola which seemed to help. Since that episode she has felt more SOB and her daughter reports that she sounds more short of breath when talking. She has had no recurrent nausea or chest pressure. She has had no fever, chills, cough, change in bowel habit or other focal symptoms.  Past Medical History  Diagnosis Date  . Acute upper gastrointestinal hemorrhage   . Stiffness of joint, lower leg   . Constipation   . Weakness   . Dyspepsia   . Pulmonary nodule     right upper lobe  . Anemia   . Osteoporosis   . Glaucoma   . Hypertension   . Hyperlipidemia    Past Surgical History  Procedure Date  . Cataract extraction    No family history on file. History   Social History  . Marital Status: Widowed    Spouse Name: N/A    Number of Children: N/A  . Years of Education: N/A   Occupational History  . Not on file.   Social History Main Topics  . Smoking status: Not on file  . Smokeless tobacco: Not on file  . Alcohol Use:   . Drug Use:   . Sexually Active:    Other Topics Concern  . Not on file   Social History Narrative  . No narrative on file       Review of Systems Review of Systems  Constitutional:  Negative for fever, chills, activity change and unexpected weight change.  HENT:  for hearing loss, ear pain, congestion, neck stiffness and postnasal drip.   Eyes: Negative for pain, discharge and visual disturbance.  Respiratory: Negative for chest tightness and wheezing.   Cardiovascular: Positive for atypical chest pain without palpitations.       [No decreased exercise tolerance, but  increased breathlessness Gastrointestinal: [No change in bowel habit. No bloating or gas. No reflux or indigestion Genitourinary: Negative for urgency, frequency, flank pain and difficulty urinating.  Musculoskeletal: Negative for myalgias, back pain, arthralgias and gait problem.  Neurological: Negative for dizziness, tremors, weakness and headaches.  Hematological: Negative for adenopathy.  Psychiatric/Behavioral: Negative for behavioral problems and dysphoric mood.       Objective:   Physical Exam WNWD eldelry AA woman in no distress HEENT - torus palatini, no oral lesions Chest -clear to auscultation Cor - regular rate and rhythm        Assessment & Plan:  1. Atypical chest pain - concerned for NSTEMI. EKG is equivocal due to RBBB. She is currently pain free.  Plan - LDL isoenzymes           For 2 D echo to evaluate for wall motion abnormality and EF - schedule for Friday, May 11th - patient aware.           Instructed to take 81mg  aspirin daily.           Continue all her medications  2. Depression - daughter is concerned about depression in her mother. [name] does admit to some depressive symptoms.  Plan - will discuss further and consider medical therapy.

## 2010-06-17 ENCOUNTER — Ambulatory Visit (HOSPITAL_COMMUNITY): Payer: Medicare Other | Attending: Internal Medicine | Admitting: Radiology

## 2010-06-17 DIAGNOSIS — R0989 Other specified symptoms and signs involving the circulatory and respiratory systems: Secondary | ICD-10-CM

## 2010-06-17 DIAGNOSIS — R0789 Other chest pain: Secondary | ICD-10-CM

## 2010-06-17 LAB — LACTATE DEHYDROGENASE, ISOENZYMES
LD1/LD2 Ratio: 0.9
LDH 3: 21 % (ref 16–26)
LDH 4: 7 % (ref 3–12)
LDH 5: 12 % (ref 3–14)

## 2010-06-19 ENCOUNTER — Telehealth: Payer: Self-pay | Admitting: Internal Medicine

## 2010-06-19 NOTE — Telephone Encounter (Signed)
Please call patient: labs show no sign of heart attack. 2D echo shows changes of long term high blood pressure, slightly dimnished heart function (appropriate for age) and no evidence of any heart damage.

## 2010-06-20 NOTE — Telephone Encounter (Signed)
Informed pts daughter of her results

## 2010-06-22 ENCOUNTER — Inpatient Hospital Stay (HOSPITAL_COMMUNITY)
Admission: EM | Admit: 2010-06-22 | Discharge: 2010-06-24 | DRG: 293 | Disposition: A | Discharge status: Home Health | Payer: Medicare Other | Attending: Internal Medicine | Admitting: Internal Medicine

## 2010-06-22 ENCOUNTER — Emergency Department (HOSPITAL_COMMUNITY): Payer: Medicare Other

## 2010-06-22 DIAGNOSIS — I5031 Acute diastolic (congestive) heart failure: Principal | ICD-10-CM | POA: Diagnosis present

## 2010-06-22 DIAGNOSIS — I10 Essential (primary) hypertension: Secondary | ICD-10-CM | POA: Diagnosis present

## 2010-06-22 DIAGNOSIS — D72829 Elevated white blood cell count, unspecified: Secondary | ICD-10-CM | POA: Diagnosis present

## 2010-06-22 DIAGNOSIS — I509 Heart failure, unspecified: Secondary | ICD-10-CM | POA: Diagnosis present

## 2010-06-22 DIAGNOSIS — M810 Age-related osteoporosis without current pathological fracture: Secondary | ICD-10-CM | POA: Diagnosis present

## 2010-06-22 LAB — URINE MICROSCOPIC-ADD ON

## 2010-06-22 LAB — CARDIAC PANEL(CRET KIN+CKTOT+MB+TROPI)
CK, MB: 5.9 ng/mL — ABNORMAL HIGH (ref 0.3–4.0)
Relative Index: 5.2 — ABNORMAL HIGH (ref 0.0–2.5)
Troponin I: 0.32 ng/mL (ref <0.30)

## 2010-06-22 LAB — URINALYSIS, ROUTINE W REFLEX MICROSCOPIC
Bilirubin Urine: NEGATIVE
Glucose, UA: NEGATIVE mg/dL
Nitrite: NEGATIVE
Specific Gravity, Urine: 1.011 (ref 1.005–1.030)
pH: 6 (ref 5.0–8.0)

## 2010-06-22 LAB — CK TOTAL AND CKMB (NOT AT ARMC): CK, MB: 4 ng/mL (ref 0.3–4.0)

## 2010-06-22 LAB — CBC
HCT: 39.7 % (ref 36.0–46.0)
MCHC: 30.7 g/dL (ref 30.0–36.0)
MCV: 90.2 fL (ref 78.0–100.0)
Platelets: 318 10*3/uL (ref 150–400)
RDW: 14.2 % (ref 11.5–15.5)

## 2010-06-22 LAB — PRO B NATRIURETIC PEPTIDE: Pro B Natriuretic peptide (BNP): 4503 pg/mL — ABNORMAL HIGH (ref 0–450)

## 2010-06-22 LAB — DIFFERENTIAL
Basophils Absolute: 0 10*3/uL (ref 0.0–0.1)
Eosinophils Absolute: 0.1 10*3/uL (ref 0.0–0.7)
Eosinophils Relative: 1 % (ref 0–5)
Lymphocytes Relative: 22 % (ref 12–46)
Lymphs Abs: 3.1 10*3/uL (ref 0.7–4.0)
Monocytes Absolute: 0.6 10*3/uL (ref 0.1–1.0)

## 2010-06-22 LAB — COMPREHENSIVE METABOLIC PANEL
ALT: 35 U/L (ref 0–35)
AST: 42 U/L — ABNORMAL HIGH (ref 0–37)
Albumin: 3.4 g/dL — ABNORMAL LOW (ref 3.5–5.2)
Calcium: 9.6 mg/dL (ref 8.4–10.5)
GFR calc Af Amer: 45 mL/min — ABNORMAL LOW (ref >60)
Sodium: 145 mEq/L (ref 135–145)
Total Protein: 6.9 g/dL (ref 6.0–8.3)

## 2010-06-22 LAB — TROPONIN I: Troponin I: <0.3 ng/mL (ref <0.30)

## 2010-06-22 LAB — LIPASE, BLOOD: Lipase: 26 U/L (ref 11–59)

## 2010-06-23 DIAGNOSIS — I5030 Unspecified diastolic (congestive) heart failure: Secondary | ICD-10-CM

## 2010-06-23 LAB — URINE CULTURE: Culture: NO GROWTH

## 2010-06-23 LAB — COMPREHENSIVE METABOLIC PANEL
ALT: 23 U/L (ref 0–35)
AST: 25 U/L (ref 0–37)
Albumin: 2.9 g/dL — ABNORMAL LOW (ref 3.5–5.2)
Alkaline Phosphatase: 89 U/L (ref 39–117)
BUN: 23 mg/dL (ref 6–23)
Chloride: 102 mEq/L (ref 96–112)
GFR calc Af Amer: 43 mL/min — ABNORMAL LOW (ref >60)
Potassium: 3.1 mEq/L — ABNORMAL LOW (ref 3.5–5.1)
Total Bilirubin: 0.3 mg/dL (ref 0.3–1.2)

## 2010-06-23 LAB — CBC
MCV: 88.1 fL (ref 78.0–100.0)
Platelets: 257 10*3/uL (ref 150–400)
RBC: 3.95 MIL/uL (ref 3.87–5.11)
WBC: 9.4 10*3/uL (ref 4.0–10.5)

## 2010-06-24 LAB — BASIC METABOLIC PANEL WITH GFR
BUN: 27 mg/dL — ABNORMAL HIGH (ref 6–23)
CO2: 33 meq/L — ABNORMAL HIGH (ref 19–32)
Calcium: 9.6 mg/dL (ref 8.4–10.5)
Chloride: 103 meq/L (ref 96–112)
Creatinine, Ser: 1.43 mg/dL — ABNORMAL HIGH (ref 0.4–1.2)
GFR calc non Af Amer: 34 mL/min — ABNORMAL LOW
Glucose, Bld: 91 mg/dL (ref 70–99)
Potassium: 4.2 meq/L (ref 3.5–5.1)
Sodium: 143 meq/L (ref 135–145)

## 2010-06-24 LAB — PRO B NATRIURETIC PEPTIDE: Pro B Natriuretic peptide (BNP): 4949 pg/mL — ABNORMAL HIGH (ref 0–450)

## 2010-06-24 NOTE — Assessment & Plan Note (Signed)
[facility]                                 ON-CALL NOTE   NAME:Hanover, TISA WEISEL                    MRN:          [id number]  DATE:03/03/2006                            DOB:          [birth year]    TIME RECEIVED:  2:02 p.m.   CALLER:  [name].   She sees Dr. Debby Bud.   TELEPHONE:  [phone].   Patient was discharged from the hospital yesterday with a diagnosis of  stomach ulcers.  She was to begin Protonix 40 mg twice daily.  They went  to the pharmacy this morning and was told that her insurance would not  cover it.  They want to know what to do over the weekend.  My response  is to purchase Prilosec over-the-counter and to take four tablets at a  time twice a day over the weekend.  They can then contact Dr. Debby Bud on  Monday morning.     Tera Mater. Clent Ridges, MD  Electronically Signed    SAF/MedQ  DD: 03/03/2006  DT: 03/03/2006  Job #: [id number]

## 2010-06-24 NOTE — Assessment & Plan Note (Signed)
[facility]                         GASTROENTEROLOGY OFFICE NOTE   NAME:Melanie Clements, Melanie Clements                      MRN:          [id number]  DATE:03/26/2006                            DOB:          1917-11-18    PROBLEM:  1. Recent gastrointestinal bleed.  2. History of colonic polyposis.   HISTORY:  Ms. Breth has returned for scheduled GI followup.  She was  hospitalized in January 2008 with iron-deficiency anemia.  She was found  to have an active duodenal ulcer causing a partial gastric outlet  obstruction.  Esophagitis was also noted.  She was hospitalized with  melena.  She has been treated with Protonix without recurrent bleeding.  She is without nausea or vomiting.  She has a history of colonic  polyposis and was last examined in 2006.  Colonoscopy was recommended in  1 year.  This was not done and she has no lower GI complaints.   MEDICATIONS INCLUDE:  Lipitor, Evista, enalapril/HCTZ, Protonix, and  iron.   PHYSICAL EXAMINATION:  Pulse 84, blood pressure 120/70, weight 127.  Her  template exam was normal.  Rectal deferred.   IMPRESSION:  1. History of recent GI bleed with partial gastric outlet obstruction      secondary to an active duodenal ulcer.  The patient is currently      asymptomatic.  2. History of colonic polyposis.   RECOMMENDATIONS:  1. Continue Protonix.  2. Followup colonoscopy.     Barbette Hair. Arlyce Dice, MD,FACG  Electronically Signed    RDK/MedQ  DD: 03/26/2006  DT: 03/26/2006  Job #: [id number]

## 2010-06-24 NOTE — Assessment & Plan Note (Signed)
[facility]                                 ON-CALL NOTE   NAME:Clements, Melanie                      MRN:          [id number]  DATE:02/25/2006                            DOB:          1917-08-15    PHONE NUMBER:  [phone].   I spoke to [name], her caregiver.  This is the second call tonight.  The first one was at 2:02 p.m.  This one came in at 7:41 p.m.  A patient  of Dr. Debby Bud.  I am Dr. Milinda Antis, on call.  Chief complaint of stomach  pain.  She is calling back to say that Melanie Clements is having  significant stomach pain; however, now her stool is very dark black in  color.  She said that in the past that was from Pepto-Bismol, but she  had not had Pepto-Bismol for quite a few days.  She found out her  medicines she is on from Dr. Jonny Ruiz was Glycolax for constipation and also  AcipHex, and she wanted to know if she needed to take her to the  hospital.  I said if she is still having significant abdominal pain and  her stool is very dark in color, that could be a sign of internal  bleeding or a stomach ulcer and that she needed to take her to the  emergency room for evaluation, and she said she was taking her to [facility] now.     Marne A. Tower, MD  Electronically Signed    MAT/MedQ  DD: 02/25/2006  DT: 02/25/2006  Job #: [id number]   cc:   Rosalyn Gess. Norins, MD  Idamae Schuller A. Milinda Antis, MD

## 2010-06-24 NOTE — Discharge Summary (Signed)
NAME:  Melanie Clements, Melanie Clements             ACCOUNT NO.:  [id number]   MEDICAL RECORD NO.:  [id number]          PATIENT TYPE:  INP   LOCATION:  1416                         FACILITY:  [facility]   PHYSICIAN:  Corwin Levins, MD      DATE OF BIRTH:  [birth year]   DATE OF ADMISSION:  02/25/2006  DATE OF DISCHARGE:  03/03/2006                               DISCHARGE SUMMARY   DISCHARGE DIAGNOSES:  1. Duodenal ulcer with partial gastric outlet obstruction.  2. Normocytic anemia with episode of melena/gastrointestinal bleed.  3. Helicobacter pylori positive, now being treated.  4. History of hypertension.  5. History of hyperlipidemia.  6. Right renal stone.  7. Diverticulosis.  8. Incidental pulmonary right lower lobe 1.5 cm nodule.  9. Incidental 3 cm intrauterine mass, unknown etiology.   CONSULTATIONS:  Gastroenterology, Dr. Lina Sar   PROCEDURE:  1. EGD, February 26, 2006, with reflux esophagitis and acute duodenal      ulcer which is large, partially obstructing.  2. Transfusion __________ .   HISTORY AND PHYSICAL:  See that dictated the day of admission, Dr.  Lovell Sheehan.   HOSPITAL COURSE:  Ms. Asby is a very nice 77 year old white female  who presents to myself in the office with 1 episode of nausea and  vomiting 2 days prior to admission, complaining of vague epigastric  discomfort.  Abdominal plain films were somewhat suggestive of gastric  outlet obstruction.  Symptoms seemed to worsen thereafter, and she was  admitted February 26, 2006, Dr. Lovell Sheehan, as above, with blood loss  anemia, found to be H. pylori positive.  She was seen and underwent EGD  rapidly by Dr. Juanda Chance with results as above.  She was begun on Protonix  as well duel antibiotic regimen for H. pylori which she tolerated well.  She was transfused which improved some symptoms.  There was no further  nausea and vomiting.  There were no further complications regarding this  issue, and she simply steadily improved her  diet and activity to  discharge.   Discharge hemoglobin is 9.8.  Also, of note is that she remained off her  Vaseretic during this hospitalization and this be held on discharge for  now and then follow up with Dr. Debby Bud.  Also, coincidentally, 2 other  issues were noted including a 1.5 cm right lower lobe nodule which will  require followup CT in 3 months.  Also, on pelvic CT, there was some  suggestion of an intrauterine mass.  This was followed up with a  transvaginal ultrasound while hospitalized which did show an  indeterminate intrauterine 3 cm mass besides another separate fibroid.  As she was eating well, ambulatory without pain and nausea and vomiting,  she was felt to have gained maximum benefit from her hospitalization and  is to be discharged home.   DISPOSITION:  Discharged to home in good condition.  There are no  activity or dietary restriction except low cholesterol, low sodium diet  as before.  She should follow up with Dr. Debby Bud in 1-2 weeks.  Consideration should be given to referral to gyn as soon as  possible for  the intrauterine mass.  Follow up possible need a biopsy.  She will also  need followup CT of the chest in 3 months.   DISCHARGE MEDICATIONS:  1. Protonix 40 mg b.i.d. x1 month and 1 daily thereafter.  2. Amoxicillin 500 mg 2 p.o. b.i.d. x7 days.  3. Biaxin 500 mg p.o. b.i.d. x7 days.  4. Evista 60 mg p.o. daily.  5. Lipitor 10 mg p.o. daily.  6. __________ 1 packet b.i.d. p.r.n.      Corwin Levins, MD  Electronically Signed     JWJ/MEDQ  D:  03/03/2006  T:  03/03/2006  Job:  [id number]   cc:   Rosalyn Gess. Norins, MD  [address]  [place]  [place] [address]   Dr. Lovell Sheehan, [facility]   Hedwig Morton. Juanda Chance, MD  [address]  [place]  [place] [address]

## 2010-06-24 NOTE — Assessment & Plan Note (Signed)
[facility]                                 ON-CALL NOTE   NAME:Melanie Clements, Melanie Clements                    MRN:          [id number]  DATE:02/25/2006                            DOB:          06-12-1917    TIME OF CALL:  2:02 p.m.   PHONE NUMBER:  [phone].   REGULAR DOCTOR:  Dr. Debby Bud, although she has also seen Dr. Jonny Ruiz. I am  Dr. Milinda Antis on call.   CHIEF COMPLAINT:  Stomach problems.   CALLER:  [name] is the caller, her daughter.  She said she saw Dr.  Jonny Ruiz on Friday with a stomach ache.  He had given her a laxative.  She  does not know the name.  Also a reflux pill, which she also does not  know the name.  They did an x-ray at [facility] and found no blockage.  She has had dark stool, but has had a history of using Pepto in the  past.  Today, she is complaining that she still feels bad.  She cannot  eat at all because of stomach pain.  She is drinking some water and [name]  says she is a very poor historian, and it is hard to tell when she is  having acute symptoms or not.  She denies fever, nausea, vomiting, or  diarrhea, or other symptoms.  I told her that if the patient is still in  pain or unable to drink fluids, that she should take her to the  emergency room for evaluation.  If she is feeling better and does not  have pain, and is able to drink fluids, they will call the office in the  morning, and if there are any other questions she will call back.  If  she worsens in general, they will definitely take her to the emergency  room.     Marne A. Tower, MD  Electronically Signed    MAT/MedQ  DD: 02/25/2006  DT: 02/25/2006  Job #: [id number]   cc:   Rosalyn Gess. Norins, MD

## 2010-06-28 ENCOUNTER — Ambulatory Visit (INDEPENDENT_AMBULATORY_CARE_PROVIDER_SITE_OTHER): Payer: Medicare Other | Admitting: Internal Medicine

## 2010-06-28 DIAGNOSIS — I509 Heart failure, unspecified: Secondary | ICD-10-CM

## 2010-06-28 DIAGNOSIS — I5033 Acute on chronic diastolic (congestive) heart failure: Secondary | ICD-10-CM

## 2010-06-29 ENCOUNTER — Encounter: Payer: Self-pay | Admitting: Internal Medicine

## 2010-06-29 DIAGNOSIS — I5032 Chronic diastolic (congestive) heart failure: Secondary | ICD-10-CM | POA: Insufficient documentation

## 2010-06-29 NOTE — Progress Notes (Signed)
Subjective:    Patient ID: Melanie Clements, female    DOB: 06-20-1917, 77 y.o.   MRN: [id number]  HPI Melanie Clements was seen early May for an episode of chest discomfort and SOB. Her evaluation included EKG without acute changes, LDH iso-enzymes that were normal and a 2 Day echo May 11th with LVH and poor relaxation c/w diastolic dysfunction. She present to ED May 12 th with acute respiratory distress and acute CHF. She was hospitalized and successfully diuresed 4+ liters. She was sent home at her dry weight, on furosemide and instructed to weigh daily. In the interval she has been at her daughter's home. Her weight has been stable. She has been on a no salt diet and fluid restriction. She is feeling pretty well with no specific complaints except wanting a more flavorful diet. Her daughters are with her today and supplement the history.  Past Medical History  Diagnosis Date  . Acute upper gastrointestinal hemorrhage   . Stiffness of joint, lower leg   . Constipation   . Weakness   . Dyspepsia   . Pulmonary nodule     right upper lobe  . Anemia   . Osteoporosis   . Glaucoma   . Hypertension   . Hyperlipidemia    Past Surgical History  Procedure Date  . Cataract extraction    No family history on file. History   Social History  . Marital Status: Widowed    Spouse Name: N/A    Number of Children: N/A  . Years of Education: N/A   Occupational History  . Not on file.   Social History Main Topics  . Smoking status: Not on file  . Smokeless tobacco: Not on file  . Alcohol Use:   . Drug Use:   . Sexually Active:    Other Topics Concern  . Not on file   Social History Narrative  . No narrative on file       Review of Systems Review of Systems  Constitutional:  Negative for fever, chills, activity change and unexpected weight change.  HENT:  Negative for hearing loss, ear pain, congestion, neck stiffness and postnasal drip.   Eyes: Negative for pain, discharge and  visual disturbance.  Respiratory: Negative for chest tightness and wheezing.   Cardiovascular: Negative for chest pain and palpitations.       [No decreased exercise tolerance Gastrointestinal: [No change in bowel habit. No bloating or gas. No reflux or indigestion Genitourinary: Negative for urgency, frequency, flank pain and difficulty urinating.  Musculoskeletal: Negative for myalgias, back pain, arthralgias and gait problem.  Neurological: Negative for dizziness, tremors, weakness and headaches.  Hematological: Negative for adenopathy.  Psychiatric/Behavioral: Negative for behavioral problems and dysphoric mood.       Objective:   Physical Exam Vitals reviewed. Gen'l - an elderly AA woman who looks younger than her stated age.  HEENT- C&S clear, PERRLA, Torus palatini noted Neck - no JVD Chest - no deformity Lungs - CTAP, no rales or wheezes Cor RRR       Assessment & Plan:  1. CHF - she is doing well with continued control. She has no c/o except wanting a liberalized diet. Answered all of dtrs questions.    Plan - continue present medications.           May cook with salt - no added salt           No fluid restriction  No indication for cardiology consult at this time.

## 2010-07-05 ENCOUNTER — Telehealth: Payer: Self-pay | Admitting: *Deleted

## 2010-07-05 ENCOUNTER — Other Ambulatory Visit: Payer: Self-pay | Admitting: Internal Medicine

## 2010-07-05 NOTE — Telephone Encounter (Signed)
Nurse informed

## 2010-07-05 NOTE — Telephone Encounter (Signed)
[facility] RN called - pt and daughter states pt only takes the cholestyramine PRN? Is this correct? If yes, PRN what? Or is pt supposed to take BID?

## 2010-07-05 NOTE — Telephone Encounter (Signed)
Med for chronic loose stools. May use prn: for loose stools take twice a day until stools are formed and then may d/c/ med

## 2010-07-13 NOTE — Discharge Summary (Signed)
NAME:  Melanie Clements, Melanie Clements             ACCOUNT NO.:  [id number]  MEDICAL RECORD NO.:  [id number]           PATIENT TYPE:  I  LOCATION:  3714                         FACILITY:  [facility]  PHYSICIAN:  Rosalyn Gess. Toivo Bordon, MD  DATE OF BIRTH:  [birth year]  DATE OF ADMISSION:  06/22/2010 DATE OF DISCHARGE:  06/24/2010                              DISCHARGE SUMMARY   ADMITTING DIAGNOSIS:  Acute congestive heart failure.  DISCHARGE DIAGNOSIS:  Diastolic heart failure controlled.  CONSULTANTS:  None.  PROCEDURES:  The patient had 2-view chest x-ray on day of admission which showed bibasilar opacities consistent with effusion and atelectasis, cannot exclude pneumonia.  HISTORY OF PRESENT ILLNESS:  Melanie Clements is a 77 year old woman with a history of hypertension, hyperlipidemia, and osteoporosis.  She is 77 years old and very functional, lives independently.  The patient was seen in the office 1 week prior to admission.  She had an episode several days prior to being seen in the office of shortness of breath and mild discomfort.  The patient's LDH, isoenzymes returned as normal. The patient had a 2-D echo as an outpatient on May 11.  This revealed severe LVH.  Ejection fraction was 50%.  Wall motion abnormalities could not be excluded.  Doppler parameters were consistent with ventricular filling pressures consistent with diastolic dysfunction.  The patient had no significant valvular disease.  The patient was brought to the emergency department because of increasing problems with shortness of breath.  History was provided by both the patient and her daughters.  The patient had been getting short of breath for more than 2 weeks.  The patient's Lasix dose had been increased to 40 mg daily for 3 days prior to admission and she returned to her normal 20 mg daily dose.  Her symptoms initially improved but was returned to a lower dose of furosemide, her symptoms progressed..  The patient also  reports substernal chest discomfort but the patient herself denied any pain or discomfort.  The patient had a cough for several weeks but no productive sputum.  In the emergency department, the patient was tachycardic at 120, tachypneic at 30, hypoxemic with O2 sat in the upper 80s.  She did respond to IV Lasix and nitrates with an O2 sat of 98% on 4 liters.  She had a chest x-ray in the emergency department as noted.  Please see the admission note as well as epic records for past medical history, family history, and social history.  HOSPITAL COURSE: 1. Cardiovascular:  The patient with congestive heart failure     diastolic in origin.  She was given IV Lasix with a good diuresis     of 4240 mL by the time of this dictation.  The patient's pro BNP     was initially greater than 4000 consistent with mild-to-moderate     CHF.  Her admission weight was 57 kg, discharge weight was 56.5 kg     which is inconsistent with her total volume loss.  Pro BNP did     remain elevated at 4949.  The patient had no significant respiratory distress.  She had cardiac enzymes that  were negative     with no evidence of acute MI.  At this point with the patient's     respiratory status being stable with her lungs being clear, she is     ready for discharge home for outpatient management of her     congestive heart failure.  DISCHARGE EXAMINATION:  VITAL SIGNS:  Temperature of 97.4, blood pressure 127/75, heart rate was 83, respirations 18, O2 sats 98% on 2 liters, discharge weight 56.5 kg as noted. GENERAL APPEARANCE:  This is a well-preserved woman looking younger than her stated chronologic age of 77. HEENT:  Unremarkable. CHEST:  The patient has shallow respirations.  There are no wheezes, no rales, no rhonchi, no increased work of breathing. CARDIOVASCULAR:  2+ radial pulse.  She had no JVD.  Precordium was quiet.  Heart rate was regular with no murmurs, rubs, or gallops. ABDOMEN:   Soft. EXTREMITIES:  Without edema. NEUROLOGIC:  Nonfocal except for the patient having a chronic lisp.  FINAL LABORATORY:  Chemistries on the day of discharge with a sodium of 143, potassium 4.2, chloride 103, CO2 of 33, BUN of 27, creatinine 1.43, glucose 91, albumin was 2.1, pBNP was 4949.  TSH was 0.672, magnesium was normal at 1.7.  The patient had a urinalysis which came back with 3- 6 RBCs per high-powered field, otherwise unremarkable.  Lipase was drawn, it was 26.  The patient's initial troponin was less than 0.3. The patient did have a repeat cardiac panel with a CK of 119, CK-MB of 5.9, troponin was 0.32 consistent with her heart failure.  DISPOSITION: 1. The patient will return home.  She will be on increased doses of     Lasix at 40 mg daily.  She will have close followup with return to     office on Tuesday, the 22nd.  In the interval she will call if she     has any progressive or increased shortness of breath. 2. The patient's condition at time of discharge dictation is stable     and improved.     Rosalyn Gess Leana Springston, MD     MEN/MEDQ  D:  06/24/2010  T:  06/24/2010  Job:  [id number]  Electronically Signed by Illene Regulus MD on 07/13/2010 09:08:55 PM

## 2010-07-15 ENCOUNTER — Telehealth: Payer: Self-pay

## 2010-07-15 NOTE — Telephone Encounter (Signed)
RN informed.

## 2010-07-15 NOTE — Telephone Encounter (Signed)
Katie, nurse with [facility] requesting clarification on if pt should be taking HCTZ 25mg  QD. Per pt this medication was listed on a start up care form but says that pt is not taking this medication nor have she seen it in the home. Please advise Thanks

## 2010-07-15 NOTE — Telephone Encounter (Signed)
No HCTZ. She is on furosemide 40mg  which she needs to continue

## 2010-07-17 DIAGNOSIS — I5030 Unspecified diastolic (congestive) heart failure: Secondary | ICD-10-CM

## 2010-07-17 DIAGNOSIS — M810 Age-related osteoporosis without current pathological fracture: Secondary | ICD-10-CM

## 2010-07-17 DIAGNOSIS — I10 Essential (primary) hypertension: Secondary | ICD-10-CM

## 2010-07-17 DIAGNOSIS — Z602 Problems related to living alone: Secondary | ICD-10-CM

## 2010-07-19 ENCOUNTER — Other Ambulatory Visit (INDEPENDENT_AMBULATORY_CARE_PROVIDER_SITE_OTHER): Payer: Medicare Other

## 2010-07-19 ENCOUNTER — Ambulatory Visit (INDEPENDENT_AMBULATORY_CARE_PROVIDER_SITE_OTHER): Payer: Medicare Other | Admitting: Internal Medicine

## 2010-07-19 ENCOUNTER — Ambulatory Visit: Payer: Medicare Other | Admitting: Internal Medicine

## 2010-07-19 ENCOUNTER — Encounter: Payer: Self-pay | Admitting: Internal Medicine

## 2010-07-19 DIAGNOSIS — I5033 Acute on chronic diastolic (congestive) heart failure: Secondary | ICD-10-CM

## 2010-07-19 DIAGNOSIS — I10 Essential (primary) hypertension: Secondary | ICD-10-CM

## 2010-07-19 DIAGNOSIS — R300 Dysuria: Secondary | ICD-10-CM

## 2010-07-19 DIAGNOSIS — I509 Heart failure, unspecified: Secondary | ICD-10-CM

## 2010-07-19 DIAGNOSIS — D649 Anemia, unspecified: Secondary | ICD-10-CM

## 2010-07-19 LAB — URINALYSIS, ROUTINE W REFLEX MICROSCOPIC
Leukocytes, UA: NEGATIVE
Nitrite: NEGATIVE
Specific Gravity, Urine: 1.01 (ref 1.000–1.030)
pH: 5 (ref 5.0–8.0)

## 2010-07-19 LAB — CBC WITH DIFFERENTIAL/PLATELET
Eosinophils Relative: 1.6 % (ref 0.0–5.0)
Lymphocytes Relative: 27.3 % (ref 12.0–46.0)
Monocytes Relative: 5.7 % (ref 3.0–12.0)
Neutrophils Relative %: 65.2 % (ref 43.0–77.0)
Platelets: 200 10*3/uL (ref 150.0–400.0)
WBC: 8.8 10*3/uL (ref 4.5–10.5)

## 2010-07-19 LAB — COMPREHENSIVE METABOLIC PANEL
Albumin: 4.2 g/dL (ref 3.5–5.2)
CO2: 28 mEq/L (ref 19–32)
GFR: 28.09 mL/min — ABNORMAL LOW (ref >60.00)
Glucose, Bld: 114 mg/dL — ABNORMAL HIGH (ref 70–99)
Potassium: 4.5 mEq/L (ref 3.5–5.1)
Sodium: 139 mEq/L (ref 135–145)
Total Bilirubin: 0.4 mg/dL (ref 0.3–1.2)
Total Protein: 7.4 g/dL (ref 6.0–8.3)

## 2010-07-19 LAB — BRAIN NATRIURETIC PEPTIDE: Pro B Natriuretic peptide (BNP): 155 pg/mL — ABNORMAL HIGH (ref 0.0–100.0)

## 2010-07-19 NOTE — Progress Notes (Signed)
  Subjective:    Patient ID: Melanie Clements, female    DOB: [birth year], 77 y.o.   MRN: [id number]  HPI Mrs. Melanie Clements presents for follow-up of CHF. She has been weighing daily and has been stable. She has not had any difficulty breathing. She has been careful with her diet and fluid intake.  She reports that she has one episode of bladder pressure and dysuria Saturday and Sunday had an episode of what sounds like rigors which passed quickly. She has not felt sick and her symptoms have not recurred.   PMH, FamHx and SocHx reviewed for any changes and relevance.    Review of Systems Review of Systems  Constitutional:  Negative for fever, chills, activity change and unexpected weight change.  HEENT:  Negative for hearing loss, ear pain, congestion, neck stiffness and postnasal drip. Negative for sore throat or swallowing problems. Negative for dental complaints.   Eyes: Negative for vision loss or change in visual acuity.  Respiratory: Negative for chest tightness and wheezing.   Cardiovascular: Negative for chest pain and palpitationNo decreased exercise tolerance Gastrointestinal: No change in bowel habit. No bloating or gas. No reflux or indigestion Genitourinary: Negative for urgency, frequency, flank pain and difficulty urinating.  Musculoskeletal: Negative for myalgias, back pain, arthralgias and gait problem.  Neurological: Negative for dizziness, tremors, weakness and headaches.  Hematological: Negative for adenopathy.  Psychiatric/Behavioral: Negative for behavioral problems and dysphoric mood.       Objective:   Physical Exam Vitals reviewed Gen'l - very well preserved nanogenarian in no distress Lungs - good breath sounds, no rales or wheezes, no increased WOB Cor - RRR, II/VI murmur best at LSB Ext - without edema' Neuro - A&O x 3, CNII-XII grossly in tact.       Assessment & Plan:

## 2010-07-20 NOTE — Assessment & Plan Note (Signed)
Interval history is great - weight has been stable, she has had no respiratory distress. She is tolerating high dose lasix. She has been conscientious about fluid restriction. She is curious and was told she should oimit herself to 6 cups of lfuid daily. She was told she had no strict dietary restrictions but to be very careful about salt. She will continue the "rule of 2's"  Lab Results  Component Value Date   WBC 8.8 07/19/2010   HGB 12.1 07/19/2010   HCT 37.3 07/19/2010   PLT 200.0 07/19/2010   CHOL 197 07/27/2009   TRIG 172.0* 07/27/2009   HDL 52.10 07/27/2009   LDLDIRECT 131.3 08/03/2008   ALT 12 07/19/2010   AST 25 07/19/2010   NA 139 07/19/2010   K 4.5 07/19/2010   CL 104 07/19/2010   CREATININE 2.1* 07/19/2010   BUN 67* 07/19/2010   CO2 28 07/19/2010   TSH 0.672 06/22/2010   INR 1.02 06/22/2010       BNP                         155                                                                 07/19/2010  Her Creatinine is up from 1.43 - she is definitely at dry weight.   Plan - decrease furosemide to 20 mg daily and liberalize fluids to the 6 cups as noted above.

## 2010-07-20 NOTE — Assessment & Plan Note (Signed)
Good control on present medications.  Plan - continue present regimen 

## 2010-07-21 ENCOUNTER — Encounter: Payer: Self-pay | Admitting: Internal Medicine

## 2010-08-13 ENCOUNTER — Other Ambulatory Visit: Payer: Self-pay | Admitting: Internal Medicine

## 2010-08-19 ENCOUNTER — Other Ambulatory Visit: Payer: Self-pay | Admitting: Internal Medicine

## 2010-09-02 NOTE — H&P (Signed)
NAME:  Melanie Clements, Melanie Clements             ACCOUNT NO.:  [id number]  MEDICAL RECORD NO.:  [id number]           PATIENT TYPE:  E  LOCATION:  [facility]                         FACILITY:  [facility]  PHYSICIAN:  Baltazar Najjar, MD     DATE OF BIRTH:  [birth year]  DATE OF ADMISSION:  06/22/2010 DATE OF DISCHARGE:                             HISTORY & PHYSICAL   PRIMARY CARE PHYSICIAN:  Rosalyn Gess. Norins, MD  CODE STATUS:  Full code.  CHIEF COMPLAINT:  Shortness of breath.  HISTORY OF PRESENT ILLNESS:  [name] is a 77 year old pleasant African American woman with history of hypertension, hyperlipidemia, and osteoporosis who presented to the ER today with worsening shortness of breath.  History is provided by the patient and daughters.  As per the daughter, the patient has couple of weeks of shortness of breath and 2 weeks ago it got worse, so they contacted her PCP and her Lasix dose was increased to 40 mg daily for 3 days and then to return back to her 20 mg p.o. daily and her symptoms had improved with that approach.  As per them the patient had baseline shortness of breath on and off, however, since yesterday her shortness of breath had worsened associated with orthopnea and they brought her to the ER. There is also reported substernal chest discomfort, however, by the ER attending.  However, as per the patient now she denies any chest pain or discomfort and family member also denied that.  She had been coughing for several weeks as well, mostly dry cough, however, she has occasional productive cough of whitish sputum.  There is no fever, no chills.  She does have nausea but no abdominal pain, no vomiting, no change in her bowel habits.  In the ED, the patient was given Lasix injections and she was also given nitrates and she had significant improvement with that approach.  As per ER, vitals on presentation, the patient was tachycardic which was sinus tachycardia at 120.  She was  tachypneic at 30 per minute and hypoxic with O2 sat on the upper 80s.  However, after she received Lasix and nitrates, the patient's vital signs had improved and her current vital signs with a pulse rate of 88 and O2 sat 98% on 4 liters of oxygen and she is clinically significantly improved as per the patient.  She had chest x-ray done in the ER that showed bibasilar effusions and atelectasis.  Pneumonia cannot be excluded.  I was contacted by the ER attending to admit the patient for CHF exacerbation. As per her daughter, the patient had recent 2-D echocardiogram, she was told that her heart is not pumping well on Monday when she was contacted.  PAST MEDICAL HISTORY: 1. Significant for hypertension. 2. Hyper lipidemia. 3. Osteoporosis. 4. History of pulmonary nodules on the right upper lobe. 5. History of upper GI bleed in February 2012.  ALLERGIES:  No known drug allergies.  HOME MEDICATIONS: 1. Travatan ophthalmic 0.004% both eyes daily at bedtime. 2. Protonix 40 mg p.o. twice daily. 3. Loratadine 10 mg daily as needed. 4. Lasix 20 mg 1 tablet p.o. daily. 5.  Enalapril/hydrochlorothiazide 10/25 mg p.o. daily. 6. Cholestyramine 4 grams pack 1 dose twice daily.  SOCIAL HISTORY:  The patient lives alone, however, she has her grandson who stays with her at nighttime and she also has 2 daughters and a son who are around.  No history of smoking, EtOH, drinking, or illicit drug use.  REVIEW OF SYSTEMS:  As above in HPI.  CHEST:  Significant for shortness of breath and cough.  CARDIOVASCULAR:  Reported chest discomfort.  As above.  ABDOMEN:  Significant for nausea with no vomiting.  No abdominal pain.  No change in bowel habits.  GU:  No dysuria, no urinary frequency, no flank pain.  CONSTITUTIONAL:  No fever or chills.  NEURO: The patient had no motor weakness or numbness.  No headaches or dizziness.  PHYSICAL EXAMINATION:  VITAL SIGNS:  Blood pressure 122/73, pulse rate of 88,  O2 sat 98% on 4 liters of oxygen via nasal cannula and temperature was 97.3. GENERAL:  She is alert in mild distress. NECK:  Supple.  No JVD. LUNGS:  Showed few bibasilar rales, no wheezing. CARDIOVASCULAR:  S1, S2.  Regular rhythm and rate. ABDOMEN:  Soft, nontender.  Bowel sounds heard normally. EXTREMITIES:  Showed no pedal edema. NEURO:  The patient is alert and she moves all her extremity.  No focal neurological deficit appreciated on exam.  LABORATORY DATA:  Urinalysis showed small blood and 3-6 RBCs, lipase 26, potassium of 3.5, glucose 211, BUN 18, creatinine 1.34, GFR of 45, AST of 42, albumin 3.4, pro BMP is 4543, troponin-I less than 0.3.  WBCs 14.6, hemoglobin 12.2, hematocrit of 39.7, platelet count 318,000. Chest x-ray showed bibasilar opacities consistent with effusions and atelectasis.  Cannot exclude pneumonia, recommend followup.  EKG showed sinus tachycardia with rate of 117 beats per minute, multiple ventricular premature complexes, LVH.  ASSESSMENT/PLAN: 1. Probable diastolic congestive heart failure. 2. Suspected pneumonia. 3. Hypertension. 4. History of hyperlipidemia. 5. Leukocytosis with absence of fever.  PLAN: 1. The patient will be admitted to telemetry floor. 2. We will diurese IV Lasix 40 mg twice daily for now and adjust the     dose as needed. 3. The patient had recent 2-D echocardiogram that showed an EF of 50%.     Also showed severe LVH and high ventricular filling pressure. 4. We will continue with ACE inhibitor as outpatient dose. 5. Continue with strict in's and o's and daily weights. 6. Given leukocytosis and chest x-ray finding which could not exclude     pneumonia.  I will start her on Avelox empirically.  She will need     a followup PA and lateral chest x-ray to reassess for possible     pneumonia. 7. We will cycle cardiac enzymes.  Her first set is negative so far     and the patient is chest pain free. 8. DVT and GI  prophylaxis. 9. We will inform Dr. Christella Noa with the patient admission. 10.The patient is currently full code as per daughter.          ______________________________ Baltazar Najjar, MD     SA/MEDQ  D:  06/22/2010  T:  06/22/2010  Job:  [id number]  cc:   Rosalyn Gess. Norins, MD  Electronically Signed by Hannah Beat MD on 09/02/2010 02:58:47 PM

## 2010-09-20 ENCOUNTER — Other Ambulatory Visit (INDEPENDENT_AMBULATORY_CARE_PROVIDER_SITE_OTHER): Payer: Medicare Other

## 2010-09-20 ENCOUNTER — Ambulatory Visit (INDEPENDENT_AMBULATORY_CARE_PROVIDER_SITE_OTHER): Payer: Medicare Other | Admitting: Internal Medicine

## 2010-09-20 VITALS — BP 128/62 | HR 74 | Temp 97.7°F | Wt 113.0 lb

## 2010-09-20 DIAGNOSIS — I509 Heart failure, unspecified: Secondary | ICD-10-CM

## 2010-09-20 DIAGNOSIS — I5033 Acute on chronic diastolic (congestive) heart failure: Secondary | ICD-10-CM

## 2010-09-20 LAB — COMPREHENSIVE METABOLIC PANEL
ALT: 15 U/L (ref 0–35)
Alkaline Phosphatase: 79 U/L (ref 39–117)
Sodium: 142 mEq/L (ref 135–145)
Total Bilirubin: 0.3 mg/dL (ref 0.3–1.2)
Total Protein: 6.9 g/dL (ref 6.0–8.3)

## 2010-09-20 MED ORDER — FUROSEMIDE 20 MG PO TABS: 20.0000 mg | ORAL_TABLET | Freq: Every day | ORAL | Status: DC

## 2010-09-21 NOTE — Progress Notes (Signed)
  Subjective:    Patient ID: SHAVONTA GOSSEN, female    DOB: 1917/11/14, 77 y.o.   MRN: [id number]  HPI Mrs. Mula presents for follow-up of diastolic CHF for which she was hospitalized in May '12. In the interval since her last visit 2 months ago she has been doing well. Her weight has been stable, she denies any ankle swelling or increased SOB. She has been adherent to a salt restricted diet and a 6 cup fluid restriction by her report and confirmed by her daughters present with her today. She does worry about her condition and fears having to return to hospital.  She has a small appetite and is concerned about her weight. She also has some emotional issues centered on aging, living alone and not being as active and healthy as she is used to.   PMH, FamHx and SocHx reviewed for any changes and relevance.    Review of Systems System review is negative for any constitutional, cardiac, pulmonary, GI or neuro symptoms or complaints     Objective:   Physical Exam Vitals noted - reviewed last several visits and her weight has been stable Gen'l - elderly AA woman in no distress Neck - no JVD in sitting position Pul - CTAP, no rales or wheezes Cor - RRR, no peripheral edema       Assessment & Plan:

## 2010-09-21 NOTE — Assessment & Plan Note (Addendum)
Adhering to her medical regimen. She is reminded that she may have up to 6 cups of fluid daily. No evidence of decompensation. Last Bmet with increased BUN/Creatinine  Plan - continue present regimen that includes reduced dose of lasix at 20mg  daily , good BP control and fluid/salt restriction           Follow up Bmet           ROV 2 months  Addendum - BUN 33, Creatinine 1.4 - normal

## 2010-10-04 ENCOUNTER — Telehealth: Payer: Self-pay | Admitting: *Deleted

## 2010-10-04 NOTE — Telephone Encounter (Signed)
Daughter is req a call back, has questions about pt's medication furosemide.

## 2010-10-05 NOTE — Telephone Encounter (Signed)
Spoke w/Daughter. Pt has been c/o blurred vision for a few mths. She is due to see Dr Michaell Cowing(?), her eye MD in September. Eye MD told patient that she should not be concerned about furosemide. Pt feels that furosemide may be the cause of her blurred vision and family wants Dr Debby Bud to advise.

## 2010-10-05 NOTE — Telephone Encounter (Signed)
Very unlikley that furosemide is the cause of blurred vision. The furosemide does keep her out of congestive heart failure!!

## 2010-10-06 NOTE — Telephone Encounter (Signed)
Left detailed VM for [name]

## 2010-11-05 ENCOUNTER — Other Ambulatory Visit: Payer: Self-pay | Admitting: Internal Medicine

## 2010-11-22 ENCOUNTER — Ambulatory Visit (INDEPENDENT_AMBULATORY_CARE_PROVIDER_SITE_OTHER): Payer: Medicare Other | Admitting: Internal Medicine

## 2010-11-22 VITALS — BP 136/78 | HR 48 | Temp 97.8°F | Wt 114.0 lb

## 2010-11-22 DIAGNOSIS — D649 Anemia, unspecified: Secondary | ICD-10-CM

## 2010-11-22 DIAGNOSIS — I10 Essential (primary) hypertension: Secondary | ICD-10-CM

## 2010-11-22 DIAGNOSIS — I5032 Chronic diastolic (congestive) heart failure: Secondary | ICD-10-CM

## 2010-11-22 DIAGNOSIS — Z23 Encounter for immunization: Secondary | ICD-10-CM

## 2010-11-22 DIAGNOSIS — I509 Heart failure, unspecified: Secondary | ICD-10-CM

## 2010-11-25 NOTE — Assessment & Plan Note (Signed)
BP Readings from Last 3 Encounters:  11/22/10 136/78  09/20/10 128/62  07/19/10 114/60   Good control. Continue present medications

## 2010-11-25 NOTE — Assessment & Plan Note (Signed)
Patinet is doing well with no signs of decompensation.  Plan - continue present medications           Continue low salt diet and fluid restriction

## 2010-11-25 NOTE — Assessment & Plan Note (Signed)
Lab Results  Component Value Date   WBC 8.8 07/19/2010   HGB 12.1 07/19/2010   HCT 37.3 07/19/2010   MCV 87.7 07/19/2010   PLT 200.0 07/19/2010   Very stable and doing well

## 2010-11-25 NOTE — Progress Notes (Signed)
  Subjective:    Patient ID: Melanie Clements, female    DOB: [birth year], 77 y.o.   MRN: [id number]  HPI Melanie Clements presents for follow-up of diastolic dysfunction and heart failure. Her daughter is with her to corroborate her interval history. She has been doing well: no SOB, tolerating medications, tolerating fluid restrictions. She has had no chest pain. She has no GI symptoms at today's visit.  I have reviewed the patient's medical history in detail and updated the computerized patient record.    Review of Systems System review is negative for any constitutional, cardiac, pulmonary, GI or neuro symptoms or complaints other than as described in the HPI.     Objective:   Physical Exam Vitals noted - good BP control, weight is down a little. Very elderly AA woman in no distress Pulm - normal respirations with no increased work of breathing, no rales or wheezes Cor - RRR Abdomen - soft, BS+       Assessment & Plan:  1. Weight loss - patient is concerned about weight loss. She has a good appetite. She denies dysphagia, odynophagia or dyspepsis. Wt Readings from Last 3 Encounters:  11/22/10 114 lb (51.71 kg)  09/20/10 113 lb (51.256 kg)  07/19/10 113 lb (51.256 kg)   Chart review reveals that her weight has been stable over the last several months.

## 2011-01-03 ENCOUNTER — Other Ambulatory Visit: Payer: Self-pay | Admitting: Internal Medicine

## 2011-01-24 ENCOUNTER — Ambulatory Visit (INDEPENDENT_AMBULATORY_CARE_PROVIDER_SITE_OTHER): Payer: Medicare Other | Admitting: Internal Medicine

## 2011-01-24 DIAGNOSIS — I5032 Chronic diastolic (congestive) heart failure: Secondary | ICD-10-CM

## 2011-01-24 DIAGNOSIS — I10 Essential (primary) hypertension: Secondary | ICD-10-CM

## 2011-01-24 DIAGNOSIS — K3189 Other diseases of stomach and duodenum: Secondary | ICD-10-CM

## 2011-01-24 DIAGNOSIS — I509 Heart failure, unspecified: Secondary | ICD-10-CM

## 2011-01-24 DIAGNOSIS — R0789 Other chest pain: Secondary | ICD-10-CM

## 2011-01-24 DIAGNOSIS — D649 Anemia, unspecified: Secondary | ICD-10-CM

## 2011-01-24 NOTE — Patient Instructions (Signed)
1. Stomach upset- mild and relieved with tums; a little tender on exam. Please take Protonix every day. 2. Sinus drainage - no sign of infection. Please take either claritin or zyrtec daily 3. Pounding sound in the head at times - normal exam, no evidence of carotid blockage. No testing seems indicated at this time. 4. Left side transient chest pain - heart sounds OK. Symptoms are not worrisome for any heart problem. 5. Shortness of breath - lungs are clear. At rest oxygen level is OK. With walking the Oxygen level does drop but you did not seem to have labored breathing. At this time will not order tests to check for the need of home oxygen.  In summary - you are in pretty good condition for the condition that you are in. Take your medications as instructed.   Happy Holiday.

## 2011-01-26 NOTE — Assessment & Plan Note (Signed)
Mild left chest pain sounds unlike angina. Transient nature w/o exertional component. Suspect more GI related. No further cardiac evaluation at this visit.   Family cautioned to seek care if she has exertional chest pain, persistent heavy pressure in the chest, associated symptoms.

## 2011-01-26 NOTE — Assessment & Plan Note (Signed)
Mrs. Roa had been given permission to reduce PPI dosing to PRN. She is now having more discomfort and she has abdominal tenderness on exam. She has been getting relief with prn TUMS.  Plan - resume taking PPI daily.

## 2011-01-26 NOTE — Progress Notes (Signed)
  Subjective:    Patient ID: Melanie Clements, female    DOB: 1917/10/10, 77 y.o.   MRN: [id number]  HPI Mr.s Dix presents for evaluation of multiple complaints: She has been having mild GI discomfort, usually after meals that is relieved with TUMS. No severe pain, no change in bowel habit, no change color of stool. No frank hematemesis or hematochezia. She has not been taking PPI daily  She has had transient mild chest pain left which she does not describe well but denies chest pressure, radiation of discomfort, SOB, diaphoresis or any exertional component  Sinus drainage continues to bother her. She denies fever, sinus pressure, cough or other signs of bacterial infection  At times she will feel a mild pounding sensation in the parietal scalp region just above her ears. It is transient, not truly painful, seems in time with her heart beat.   Shortness of breath continues to bother her but not to a degree to limit her daily activities.  Past Medical History  Diagnosis Date  . Acute upper gastrointestinal hemorrhage   . Stiffness of joint, lower leg   . Constipation   . Weakness   . Dyspepsia   . Pulmonary nodule     right upper lobe  . Anemia   . Osteoporosis   . Glaucoma   . Hypertension   . Hyperlipidemia   . CHF (congestive heart failure)     diastolic   Past Surgical History  Procedure Date  . Cataract extraction    No family history on file. History   Social History  . Marital Status: Widowed    Spouse Name: N/A    Number of Children: N/A  . Years of Education: N/A   Occupational History  . Not on file.   Social History Main Topics  . Smoking status: Never Smoker   . Smokeless tobacco: Never Used  . Alcohol Use: No  . Drug Use: No  . Sexually Active: No   Other Topics Concern  . Not on file   Social History Narrative   has a very supportive family. remains I-ADLs except for driving..  End of Life - Care: discussed with patinet and dtr: DNR.  Provided end of life materials, out of facility order and MOST form.       Review of Systems System review is negative for any constitutional, cardiac, pulmonary, GI or neuro symptoms or complaints other than as described in the HPI.     Objective:   Physical Exam Filed Vitals:   01/24/11 1407  BP: 130/80  Pulse: 94  Temp: 97.6 F (36.4 C)   Gen'l - a slender AA woman looking younger than her age in no distress HEENT - no sinus tenderness, C&S clear, EACs clear Neck - supple, no JVD, no carotid bruit Pulm - normal respirations w/o wheezes, w/o rales Cor - RRR Abd - BS+, mildly tender in the epigastrium Neuro - A&O x 3, speech clear, CN II-XII grossly intact, ambulates w/o assistance  Ambulating pulse oximetry: O2 sat drops from 93% at rest to 80% with walking 100 feet. She did not appear to have labored respirations or fatigue. With rest rapid recovery to O2 sat 90%       Assessment & Plan:

## 2011-01-26 NOTE — Assessment & Plan Note (Signed)
BP Readings from Last 3 Encounters:  01/24/11 130/80  11/22/10 136/78  09/20/10 128/62   Blood pressure is stable on present medications

## 2011-01-26 NOTE — Assessment & Plan Note (Signed)
Lungs are clear on exam but patient does desaturate with exertion. 2 D echo reviewed - c/w diastolic failure. She is not very active at home.She has been adherent to fluid limitation 1.5 liters daily. Plan - given low level of activity normally and only mild symptoms will not order pulse oximetry or home oxygen.           For progressive SOB will get official pulse oximetry, day and nocturnal, and home oxygen therapy initiated.

## 2011-01-26 NOTE — Assessment & Plan Note (Signed)
Lab Results  Component Value Date   WBC 8.8 07/19/2010   HGB 12.1 07/19/2010   HCT 37.3 07/19/2010   MCV 87.7 07/19/2010   PLT 200.0 07/19/2010   No report of any active bleeding. No lab at today's visit

## 2011-02-23 ENCOUNTER — Ambulatory Visit: Payer: Medicare Other | Admitting: Internal Medicine

## 2011-03-13 ENCOUNTER — Other Ambulatory Visit: Payer: Self-pay | Admitting: Internal Medicine

## 2011-04-24 ENCOUNTER — Encounter: Payer: Self-pay | Admitting: Internal Medicine

## 2011-04-24 ENCOUNTER — Ambulatory Visit (INDEPENDENT_AMBULATORY_CARE_PROVIDER_SITE_OTHER): Payer: 59 | Admitting: Internal Medicine

## 2011-04-24 ENCOUNTER — Other Ambulatory Visit (INDEPENDENT_AMBULATORY_CARE_PROVIDER_SITE_OTHER): Payer: 59

## 2011-04-24 VITALS — BP 132/88 | HR 101 | Resp 14 | Wt 112.2 lb

## 2011-04-24 DIAGNOSIS — L608 Other nail disorders: Secondary | ICD-10-CM

## 2011-04-24 DIAGNOSIS — D649 Anemia, unspecified: Secondary | ICD-10-CM

## 2011-04-24 DIAGNOSIS — I10 Essential (primary) hypertension: Secondary | ICD-10-CM

## 2011-04-24 DIAGNOSIS — L603 Nail dystrophy: Secondary | ICD-10-CM

## 2011-04-24 DIAGNOSIS — I509 Heart failure, unspecified: Secondary | ICD-10-CM

## 2011-04-24 DIAGNOSIS — M25669 Stiffness of unspecified knee, not elsewhere classified: Secondary | ICD-10-CM

## 2011-04-24 DIAGNOSIS — I5032 Chronic diastolic (congestive) heart failure: Secondary | ICD-10-CM

## 2011-04-24 LAB — COMPREHENSIVE METABOLIC PANEL
CO2: 29 mEq/L (ref 19–32)
Calcium: 9.9 mg/dL (ref 8.4–10.5)
Chloride: 106 mEq/L (ref 96–112)
GFR: 39.36 mL/min — ABNORMAL LOW (ref >60.00)
Glucose, Bld: 81 mg/dL (ref 70–99)
Sodium: 142 mEq/L (ref 135–145)
Total Bilirubin: 0.3 mg/dL (ref 0.3–1.2)
Total Protein: 7.4 g/dL (ref 6.0–8.3)

## 2011-04-24 LAB — CBC WITH DIFFERENTIAL/PLATELET
Eosinophils Absolute: 0.1 10*3/uL (ref 0.0–0.7)
HCT: 40.2 % (ref 36.0–46.0)
Lymphs Abs: 2 10*3/uL (ref 0.7–4.0)
MCHC: 31.4 g/dL (ref 30.0–36.0)
MCV: 90 fl (ref 78.0–100.0)
Monocytes Absolute: 0.4 10*3/uL (ref 0.1–1.0)
Neutrophils Relative %: 61.6 % (ref 43.0–77.0)
Platelets: 228 10*3/uL (ref 150.0–400.0)
RDW: 15.1 % — ABNORMAL HIGH (ref 11.5–14.6)

## 2011-04-24 LAB — BRAIN NATRIURETIC PEPTIDE: Pro B Natriuretic peptide (BNP): 415 pg/mL — ABNORMAL HIGH (ref 0.0–100.0)

## 2011-04-24 MED ORDER — ENALAPRIL MALEATE 10 MG PO TABS: ORAL_TABLET | ORAL | Status: DC

## 2011-04-24 NOTE — Assessment & Plan Note (Signed)
Continued complaint of sore and stiff knees. No effusion or deformity on exam.

## 2011-04-24 NOTE — Assessment & Plan Note (Signed)
BP Readings from Last 3 Encounters:  04/24/11 132/88  01/24/11 130/80  11/22/10 136/78   Good control on present medications.

## 2011-04-24 NOTE — Progress Notes (Signed)
  Subjective:    Patient ID: Melanie Clements, female    DOB: [birth year], 78 y.o.   MRN: [id number]  HPI Mrts. Jeffires presents for follow-up. She has good days and bad days. Her vision is getting worse due to macular degeneration. She has made it out to church yesterday. She dneis any pain or discomfort except knee pain. She did have a fall recently and has a sore ankle. She does stay thirsty and does crave more salt.  Past Medical History  Diagnosis Date  . Acute upper gastrointestinal hemorrhage   . Stiffness of joint, lower leg   . Constipation   . Weakness   . Dyspepsia   . Pulmonary nodule     right upper lobe  . Anemia   . Osteoporosis   . Glaucoma   . Hypertension   . Hyperlipidemia   . CHF (congestive heart failure)     diastolic   Past Surgical History  Procedure Date  . Cataract extraction    No family history on file. History   Social History  . Marital Status: Widowed    Spouse Name: N/A    Number of Children: N/A  . Years of Education: N/A   Occupational History  . Not on file.   Social History Main Topics  . Smoking status: Never Smoker   . Smokeless tobacco: Never Used  . Alcohol Use: No  . Drug Use: No  . Sexually Active: No   Other Topics Concern  . Not on file   Social History Narrative   has a very supportive family. remains I-ADLs except for driving..  End of Life - Care: discussed with patinet and dtr: DNR. Provided end of life materials, out of facility order and MOST form.       Review of Systems Constitutional:  Negative for fever, chills, activity change and unexpected weight change.  HEENT:  Negative for hearing loss, ear pain, congestion, neck stiffness and postnasal drip. Negative for sore throat or swallowing problems. Negative for dental complaints.   Eyes: Positive for vision loss and change in visual acuity.  Respiratory: Negative for chest tightness and wheezing. Negative for DOE.   Cardiovascular: Negative for chest pain  or palpitations. No decreased exercise tolerance Gastrointestinal: No change in bowel habit. No bloating or gas. No reflux or indigestion Genitourinary: Negative for urgency, frequency, flank pain and difficulty urinating.  Musculoskeletal: Negative for myalgias, back pain, arthralgias and gait problem.  Neurological: Negative for dizziness, tremors, weakness and headaches.  Hematological: Negative for adenopathy.  Psychiatric/Behavioral: Negative for behavioral problems and dysphoric mood.       Objective:   Physical Exam Filed Vitals:   04/24/11 1313  BP: 132/88  Pulse: 101  Resp: 14   Wt Readings from Last 3 Encounters:  04/24/11 112 lb 4 oz (50.916 kg)  01/24/11 113 lb (51.256 kg)  11/22/10 114 lb (51.71 kg)   Gen'l- thin elderly AA woman in no distress HEENT- muddy conjunctiva, no oral lesion Cor 2+ RRR no JVD, no murmurs PUlm - normal respirations, clear without rales, wheezes Ext - mild swelling left ankle but no peripheral edema.       Assessment & Plan:

## 2011-04-24 NOTE — Patient Instructions (Signed)
Blood pressure is good. There is no sign of fluid overload and will check lab today to confirm that your heart failure is controlled.  Weight is good. No change in medications. You can have as much as 2 quarts a day. We will check lab work today and you will get a letter with the results.

## 2011-04-24 NOTE — Assessment & Plan Note (Signed)
No evidence of fluid overload. Weight has been stable and she does adhere to fluid restriction.  Plan - Bmet, pBNP           Increase fluid allotment to 2 qts daily

## 2011-04-24 NOTE — Assessment & Plan Note (Signed)
Appears stable  Plan - CBC today with recommendations to follow

## 2011-04-25 ENCOUNTER — Encounter: Payer: Self-pay | Admitting: Internal Medicine

## 2011-05-17 ENCOUNTER — Telehealth: Payer: Self-pay

## 2011-05-17 MED ORDER — FUROSEMIDE 20 MG PO TABS: 20.0000 mg | ORAL_TABLET | Freq: Every day | ORAL | Status: DC

## 2011-05-17 NOTE — Telephone Encounter (Signed)
Faxed refill reuqest

## 2011-07-02 ENCOUNTER — Other Ambulatory Visit: Payer: Self-pay | Admitting: Internal Medicine

## 2011-07-24 ENCOUNTER — Encounter: Payer: Self-pay | Admitting: Internal Medicine

## 2011-07-24 ENCOUNTER — Ambulatory Visit (INDEPENDENT_AMBULATORY_CARE_PROVIDER_SITE_OTHER): Payer: 59 | Admitting: Internal Medicine

## 2011-07-24 ENCOUNTER — Other Ambulatory Visit (INDEPENDENT_AMBULATORY_CARE_PROVIDER_SITE_OTHER): Payer: 59

## 2011-07-24 VITALS — BP 138/84 | HR 80 | Temp 97.3°F | Resp 16 | Wt 109.0 lb

## 2011-07-24 DIAGNOSIS — E785 Hyperlipidemia, unspecified: Secondary | ICD-10-CM

## 2011-07-24 DIAGNOSIS — K5900 Constipation, unspecified: Secondary | ICD-10-CM

## 2011-07-24 DIAGNOSIS — D649 Anemia, unspecified: Secondary | ICD-10-CM

## 2011-07-24 DIAGNOSIS — R1013 Epigastric pain: Secondary | ICD-10-CM

## 2011-07-24 DIAGNOSIS — K3189 Other diseases of stomach and duodenum: Secondary | ICD-10-CM

## 2011-07-24 DIAGNOSIS — I10 Essential (primary) hypertension: Secondary | ICD-10-CM

## 2011-07-24 DIAGNOSIS — J984 Other disorders of lung: Secondary | ICD-10-CM

## 2011-07-24 DIAGNOSIS — I5032 Chronic diastolic (congestive) heart failure: Secondary | ICD-10-CM

## 2011-07-24 LAB — CBC WITH DIFFERENTIAL/PLATELET
Eosinophils Relative: 1.3 % (ref 0.0–5.0)
HCT: 37.7 % (ref 36.0–46.0)
Hemoglobin: 11.9 g/dL — ABNORMAL LOW (ref 12.0–15.0)
Lymphs Abs: 1.9 10*3/uL (ref 0.7–4.0)
MCV: 89.4 fl (ref 78.0–100.0)
Monocytes Absolute: 0.5 10*3/uL (ref 0.1–1.0)
Monocytes Relative: 6.6 % (ref 3.0–12.0)
Neutro Abs: 5 10*3/uL (ref 1.4–7.7)
Platelets: 202 10*3/uL (ref 150.0–400.0)
WBC: 7.5 10*3/uL (ref 4.5–10.5)

## 2011-07-24 LAB — COMPREHENSIVE METABOLIC PANEL
ALT: 14 U/L (ref 0–35)
Alkaline Phosphatase: 67 U/L (ref 39–117)
CO2: 26 mEq/L (ref 19–32)
Creatinine, Ser: 1.5 mg/dL — ABNORMAL HIGH (ref 0.4–1.2)
GFR: 43.1 mL/min — ABNORMAL LOW (ref >60.00)
Sodium: 144 mEq/L (ref 135–145)
Total Bilirubin: 0.2 mg/dL — ABNORMAL LOW (ref 0.3–1.2)
Total Protein: 7 g/dL (ref 6.0–8.3)

## 2011-07-24 NOTE — Patient Instructions (Addendum)
Weight is going down to much. Please liberalize your diet - ok to have food seasoned so you like it. OK to liberalize your fluid intake to 1.5 to 2 liters a day.  The best way to monitor for fluid retention is to check daily weight: if you can more than 2 lbs in 2 days you will double your lasix for 2 days and call the doctor.  For a good health bowel habit take one dose a day of a bulk laxative, such as metamucil wafer or fibercon tablets.  Lab today to check on kidney function. Blood sugar.

## 2011-07-26 NOTE — Assessment & Plan Note (Signed)
She is currently not taking any medication on a regular basis. For discomfort she may use otc H2 blocker or PPI.

## 2011-07-26 NOTE — Assessment & Plan Note (Signed)
No respiratory symptoms. Has had a series of CT chest exams revealing a very stable nodule.  Plan No further w/u at this time

## 2011-07-26 NOTE — Progress Notes (Signed)
Subjective:    Patient ID: Melanie Clements, female    DOB: 01/02/1918, 78 y.o.   MRN: [id number]  HPI Melanie Clements presents for follow up. She is accompanied by her 2 daughters. Pirmary concern of the family is continued weight loss - down now to 109 lbs. Melanie Clements does complain of unsatiated thirst due to her fluid restriction and that her food is too bland. She is very cautious and adherent to a no salt and fluid restricted regimen due to her fear of recurrent CHF. She reports that she feels fine and other than the above has no complaints.  Past Medical History  Diagnosis Date  . Acute upper gastrointestinal hemorrhage   . Stiffness of joint, lower leg   . Constipation   . Weakness   . Dyspepsia   . Pulmonary nodule     right upper lobe  . Anemia   . Osteoporosis   . Glaucoma   . Hypertension   . Hyperlipidemia   . CHF (congestive heart failure)     diastolic   Past Surgical History  Procedure Date  . Cataract extraction    No family history on file. History   Social History  . Marital Status: Widowed    Spouse Name: N/A    Number of Children: N/A  . Years of Education: N/A   Occupational History  . Not on file.   Social History Main Topics  . Smoking status: Never Smoker   . Smokeless tobacco: Never Used  . Alcohol Use: No  . Drug Use: No  . Sexually Active: No   Other Topics Concern  . Not on file   Social History Narrative   has a very supportive family. remains I-ADLs except for driving..  End of Life - Care: discussed with patinet and dtr: DNR. Provided end of life materials, out of facility order and MOST form.    Current Outpatient Prescriptions on File Prior to Visit  Medication Sig Dispense Refill  . calcium carbonate (TUMS LASTING EFFECTS) 500 MG chewable tablet Chew 1 tablet by mouth as needed.        . cholestyramine (QUESTRAN) 4 G packet USE 1 PACKET IN WATER TWICE A DAY  60 packet  1  . Dorzolamide HCl-Timolol Mal (COSOPT OP) Apply to  eye 2 (two) times daily.        . enalapril (VASOTEC) 10 MG tablet TAKE 1 TABLET BY MOUTH EVERY DAY  90 tablet  1  . furosemide (LASIX) 20 MG tablet Take 1 tablet (20 mg total) by mouth daily.  90 tablet  3  . loratadine (CLARITIN) 10 MG tablet Take 10 mg by mouth daily.        Marland Kitchen loteprednol (LOTEMAX) 0.2 % SUSP 1 drop 4 (four) times daily.        . pantoprazole (PROTONIX) 40 MG tablet TAKE 1 TABLET BY MOUTH EVERY MORNING  90 tablet  1  . promethazine-codeine (PHENERGAN WITH CODEINE) 6.25-10 MG/5ML syrup Take 5 mLs by mouth every 6 (six) hours as needed.        . travoprost, benzalkonium, (TRAVATAN) 0.004 % ophthalmic solution Place 1 drop into both eyes at bedtime.            Review of Systems System review is negative for any constitutional, cardiac, pulmonary, GI or neuro symptoms or complaints other than as described in the HPI.     Objective:   Physical Exam Filed Vitals:   07/24/11 1312  BP: 138/84  Pulse: 80  Temp: 97.3 F (36.3 C)  Resp: 16   Wt Readings from Last 3 Encounters:  07/24/11 109 lb (49.442 kg)  04/24/11 112 lb 4 oz (50.916 kg)  01/24/11 113 lb (51.256 kg)   Gen'l - Very thin elderly AA woman in no distress HEENT - C&S clear, Cor - RRR, no JVD, 2+ peripheral pulse Pulm- normal respirations Neuro - A&O x 3, speech is clear, cognition appears normal.  Lab Results  Component Value Date   WBC 7.5 07/24/2011   HGB 11.9* 07/24/2011   HCT 37.7 07/24/2011   PLT 202.0 07/24/2011   GLUCOSE 81 07/24/2011   CHOL 197 07/27/2009   TRIG 172.0* 07/27/2009   HDL 52.10 07/27/2009   LDLDIRECT 131.3 08/03/2008   LDLCALC 111* 07/27/2009   ALT 14 07/24/2011   AST 19 07/24/2011   NA 144 07/24/2011   K 4.1 07/24/2011   CL 111 07/24/2011   CREATININE 1.5* 07/24/2011   BUN 39* 07/24/2011   CO2 26 07/24/2011   TSH 0.672 06/22/2010   INR 1.02 06/22/2010         Assessment & Plan:

## 2011-07-26 NOTE — Assessment & Plan Note (Signed)
No sign of decompensation: normal pulmonary exam. Discussed liberalization of her regimen so that she will be more comfortable. She is advised that she can tolerate 1.5 liters of fluid daily, that she can season her food to taste so that her calorie intake will rise and she will gain weight.  Plan Continue present medications  Liberalize fluids and seasonings

## 2011-07-26 NOTE — Assessment & Plan Note (Signed)
No c/o constipation at today's visit.

## 2011-07-26 NOTE — Assessment & Plan Note (Signed)
Lab Results  Component Value Date   HGB 11.9* 07/24/2011   Stable.

## 2011-07-26 NOTE — Assessment & Plan Note (Signed)
On cholestyramine for chronic constipation. He lipids have been reasonable and at age 78 will not pursue further evaluation or treatment lipids.

## 2011-07-30 ENCOUNTER — Encounter: Payer: Self-pay | Admitting: Internal Medicine

## 2011-09-06 ENCOUNTER — Encounter: Payer: Self-pay | Admitting: Internal Medicine

## 2011-09-06 ENCOUNTER — Other Ambulatory Visit (INDEPENDENT_AMBULATORY_CARE_PROVIDER_SITE_OTHER): Payer: 59

## 2011-09-06 ENCOUNTER — Telehealth: Payer: Self-pay | Admitting: Internal Medicine

## 2011-09-06 ENCOUNTER — Ambulatory Visit (INDEPENDENT_AMBULATORY_CARE_PROVIDER_SITE_OTHER): Payer: 59 | Admitting: Internal Medicine

## 2011-09-06 VITALS — BP 130/60 | HR 80 | Temp 97.4°F | Resp 16 | Wt 111.0 lb

## 2011-09-06 DIAGNOSIS — R300 Dysuria: Secondary | ICD-10-CM

## 2011-09-06 LAB — URINALYSIS, ROUTINE W REFLEX MICROSCOPIC
Bilirubin Urine: NEGATIVE
Ketones, ur: NEGATIVE
Total Protein, Urine: 30
Urine Glucose: NEGATIVE
pH: 5.5 (ref 5.0–8.0)

## 2011-09-06 NOTE — Progress Notes (Signed)
  Subjective:    Patient ID: Melanie Clements, female    DOB: February 04, 1918, 78 y.o.   MRN: [id number]  HPI Melanie Clements presents with a 24 hr h/o increased urinary frequency, dysuria and has noted blood on the tissue post-micturition. She denies fever, chills, back pain, N/V  PMH, FamHx and SocHx reviewed for any changes and relevance.  Current Outpatient Prescriptions on File Prior to Visit  Medication Sig Dispense Refill  . atropine 1 % ophthalmic solution Place 1 drop into the right eye 2 (two) times daily.      . calcium carbonate (TUMS LASTING EFFECTS) 500 MG chewable tablet Chew 1 tablet by mouth as needed.        . cholestyramine (QUESTRAN) 4 G packet USE 1 PACKET IN WATER TWICE A DAY  60 packet  1  . Dorzolamide HCl-Timolol Mal (COSOPT OP) Apply to eye 2 (two) times daily.        . enalapril (VASOTEC) 10 MG tablet TAKE 1 TABLET BY MOUTH EVERY DAY  90 tablet  1  . furosemide (LASIX) 20 MG tablet Take 1 tablet (20 mg total) by mouth daily.  90 tablet  3  . loratadine (CLARITIN) 10 MG tablet Take 10 mg by mouth daily.        Marland Kitchen loteprednol (LOTEMAX) 0.2 % SUSP 1 drop 4 (four) times daily.        . pantoprazole (PROTONIX) 40 MG tablet TAKE 1 TABLET BY MOUTH EVERY MORNING  90 tablet  1  . prednisoLONE acetate (PRED FORTE) 1 % ophthalmic suspension 1 drop 4 (four) times daily. In right eye      . promethazine-codeine (PHENERGAN WITH CODEINE) 6.25-10 MG/5ML syrup Take 5 mLs by mouth every 6 (six) hours as needed.        . sodium chloride (MURO 128) 5 % ophthalmic ointment Place 1 drop into the right eye at bedtime.      . travoprost, benzalkonium, (TRAVATAN) 0.004 % ophthalmic solution Place 1 drop into both eyes at bedtime.            Review of Systems System review is negative for any constitutional, cardiac, pulmonary, GI or neuro symptoms or complaints other than as described in the HPI.     Objective:   Physical Exam Filed Vitals:   09/06/11 1107  BP: 130/60  Pulse: 80  Temp:  97.4 F (36.3 C)  Resp: 16   Gen'l- Elderly AA woman in no distress Cor- RRR Pulm - normal respiration Abd - no suprapubic tenderness, no flank tenderness.       Assessment & Plan:  Dysuria - patient with minor symptoms, but better today.  Plan - U/A - recommendations to follow.

## 2011-09-06 NOTE — Telephone Encounter (Signed)
Caller: Gail/Other; PCPIllene Regulus; CB#: [phone];  Call regarding Urinary Sx; Onset 09/05/11.  Burning with urination; Afebrile/subjective.  Emergent sx ruled out.  Home care and parameters for callback per Urinary Sx - female protocol.  Appt. with Dr. Debby Bud at 11:00.

## 2011-09-15 ENCOUNTER — Telehealth: Payer: Self-pay | Admitting: Internal Medicine

## 2011-09-15 NOTE — Telephone Encounter (Signed)
The pt called the triage line in hopes of getting information on her last office visit.  She did not say specifically what questions she had.  I called the patient back, there was no answer.  I left a voice mail on her machine.    Thanks!

## 2011-09-15 NOTE — Telephone Encounter (Signed)
1. Please try calling Monday 2. Please send her a copy of the last OV note

## 2011-09-15 NOTE — Telephone Encounter (Signed)
Left message on home # of returning call about her last office visit.

## 2011-09-18 ENCOUNTER — Telehealth: Payer: Self-pay | Admitting: Internal Medicine

## 2011-09-18 NOTE — Telephone Encounter (Signed)
Spoke with [name]. Informed of U/A no evidence of infection. Patient daughter to call back on wed. If continues with problems.

## 2011-09-18 NOTE — Telephone Encounter (Signed)
Sorry for the delay. U/A with blood but no evidence of bacterial infection. If she continues to have frank hematuria will empirically treat with septra DS. If there is still hematuria - will refer to GU.

## 2011-09-18 NOTE — Telephone Encounter (Signed)
Spoke withdaughter, gail,t this AM of lab results show no bacteria.. Mailed copy of OV,

## 2011-09-18 NOTE — Telephone Encounter (Signed)
Melanie Clements/Melanie Clements is requesting ua test results--

## 2011-09-20 ENCOUNTER — Telehealth: Payer: Self-pay | Admitting: Internal Medicine

## 2011-09-20 NOTE — Telephone Encounter (Signed)
May have another u/a if having symptoms - dysuria.

## 2011-09-20 NOTE — Telephone Encounter (Signed)
Caller: Gail/Child is calling regarding no change in patient's complaint of lower abdominal pain and blood in urine. Patient seen on 09/18/11 for same. Verified in Epic; Urinalysis was negative and results called to Gail/daughter on 09/18/11 with instructions for follow up if symptoms persisted. [name] not with patient so unable to triage but denies patient is having any emergent symptoms now and there is no change since last seen on 8/12. Verified Pharmacy was [facility] on file. Alternate #'s for [name] are [phone] and sister Judy-[phone], home-[phone]. Please call [name] and advise.

## 2011-09-22 ENCOUNTER — Other Ambulatory Visit (INDEPENDENT_AMBULATORY_CARE_PROVIDER_SITE_OTHER): Payer: 59

## 2011-09-22 ENCOUNTER — Telehealth: Payer: Self-pay | Admitting: Internal Medicine

## 2011-09-22 DIAGNOSIS — N3000 Acute cystitis without hematuria: Secondary | ICD-10-CM

## 2011-09-22 LAB — URINALYSIS, ROUTINE W REFLEX MICROSCOPIC
Leukocytes, UA: NEGATIVE
Specific Gravity, Urine: 1.015 (ref 1.000–1.030)
Urobilinogen, UA: 0.2 (ref 0.0–1.0)

## 2011-09-22 NOTE — Telephone Encounter (Signed)
SPOKE WITH PATIENT DAUGHTER. EXPLAINED THAT DR. Debby Bud STATED COULD HAVE ANOTHER U/A IF HER MOHTER IS HAVING SYMPTOMS. DAUGHTER STATES  MOTHER DOES NOT COMPLAIN OF SYMPTOMS EVERYDAY ONLY OCCASIONAL. Marland KitchenSTATES HER MOM HAS EYE APPT. ON Monday  AND WILL SEE IF THEY CAN GET ANOTHER U/A ON THAT DAY TO CHECK.

## 2011-09-22 NOTE — Telephone Encounter (Signed)
The pt's daughter called and is hoping to get her Mom in for a UA. (I apologize for the mis-communication when i said the order was in, i was looking at a previous order).  She does not have an order in yet, and is hoping to get that done.  Thanks!!

## 2011-09-22 NOTE — Telephone Encounter (Signed)
No problem-order entered

## 2011-10-04 ENCOUNTER — Telehealth: Payer: Self-pay | Admitting: Internal Medicine

## 2011-10-04 NOTE — Telephone Encounter (Signed)
Daughter Melanie Clements states Melanie Clements called her and said Melanie Clements's stomach did not feel well. Intermittent complaints of  shortness of breath with exertion on 8/27.  No shortness of breath on 10/04/11. No weight gain. Had urinalysis 2 weeks ago due to dark urine. Did not hear results. Urine clear now. (Per nausea protocol has see provider within 72 hrs disposition due to nausea x 7 days or more.)Has previously scheduled appointment for 10/05/11 at 1530. Care advice given.

## 2011-10-05 ENCOUNTER — Ambulatory Visit (INDEPENDENT_AMBULATORY_CARE_PROVIDER_SITE_OTHER): Payer: 59 | Admitting: Internal Medicine

## 2011-10-05 ENCOUNTER — Other Ambulatory Visit (INDEPENDENT_AMBULATORY_CARE_PROVIDER_SITE_OTHER): Payer: 59

## 2011-10-05 DIAGNOSIS — I10 Essential (primary) hypertension: Secondary | ICD-10-CM

## 2011-10-05 DIAGNOSIS — R319 Hematuria, unspecified: Secondary | ICD-10-CM

## 2011-10-05 DIAGNOSIS — I5032 Chronic diastolic (congestive) heart failure: Secondary | ICD-10-CM

## 2011-10-05 DIAGNOSIS — D649 Anemia, unspecified: Secondary | ICD-10-CM

## 2011-10-05 DIAGNOSIS — R5381 Other malaise: Secondary | ICD-10-CM

## 2011-10-05 DIAGNOSIS — I452 Bifascicular block: Secondary | ICD-10-CM

## 2011-10-05 DIAGNOSIS — R5383 Other fatigue: Secondary | ICD-10-CM

## 2011-10-05 LAB — CBC WITH DIFFERENTIAL/PLATELET
Basophils Absolute: 0 10*3/uL (ref 0.0–0.1)
Hemoglobin: 12.1 g/dL (ref 12.0–15.0)
Lymphocytes Relative: 31.2 % (ref 12.0–46.0)
Monocytes Relative: 6.6 % (ref 3.0–12.0)
Neutro Abs: 4.7 10*3/uL (ref 1.4–7.7)
Neutrophils Relative %: 60 % (ref 43.0–77.0)
RDW: 15 % — ABNORMAL HIGH (ref 11.5–14.6)

## 2011-10-05 NOTE — Progress Notes (Signed)
Subjective:    Patient ID: Melanie Clements, female    DOB: 1917/06/05, 78 y.o.   MRN: [id number]  HPI Mrs. Melanie Clements presents for 4 day history of nausea, question of lower abdominal bloating, weak and generalized ill feeling. She has seen some improvement of the past days but still not feeling well. She has lost her appetite and she has difficulty swallowing- she will masticate for a prolonged period of time but can swallow. No diarrhea, no cough, no fever or chills. She does c/o postnasal drainage with phlegm in her throat that can be coughed up and it is white, w/o blood or dark mucus. She has been more SOB, especially Monday and Tuesday. She does have increased DOE.   She had a recent u/a - positive for RBCs TNTC but no bacteria, WBCs or other sign of infection.   Past Medical History  Diagnosis Date  . Acute upper gastrointestinal hemorrhage   . Stiffness of joint, lower leg   . Constipation   . Weakness   . Dyspepsia   . Pulmonary nodule     right upper lobe  . Anemia   . Osteoporosis   . Glaucoma   . Hypertension   . Hyperlipidemia   . CHF (congestive heart failure)     diastolic   Past Surgical History  Procedure Date  . Cataract extraction    No family history on file. History   Social History  . Marital Status: Widowed    Spouse Name: N/A    Number of Children: N/A  . Years of Education: N/A   Occupational History  . Not on file.   Social History Main Topics  . Smoking status: Never Smoker   . Smokeless tobacco: Never Used  . Alcohol Use: No  . Drug Use: No  . Sexually Active: No   Other Topics Concern  . Not on file   Social History Narrative   has a very supportive family. remains I-ADLs except for driving..  End of Life - Care: discussed with patinet and dtr: DNR. Provided end of life materials, out of facility order and MOST form.    Current Outpatient Prescriptions on File Prior to Visit  Medication Sig Dispense Refill  . atropine 1 %  ophthalmic solution Place 1 drop into the right eye 2 (two) times daily.      . calcium carbonate (TUMS LASTING EFFECTS) 500 MG chewable tablet Chew 1 tablet by mouth as needed.        . cholestyramine (QUESTRAN) 4 G packet USE 1 PACKET IN WATER TWICE A DAY  60 packet  1  . Dorzolamide HCl-Timolol Mal (COSOPT OP) Apply to eye 2 (two) times daily.        . enalapril (VASOTEC) 10 MG tablet TAKE 1 TABLET BY MOUTH EVERY DAY  90 tablet  1  . furosemide (LASIX) 20 MG tablet Take 1 tablet (20 mg total) by mouth daily.  90 tablet  3  . loratadine (CLARITIN) 10 MG tablet Take 10 mg by mouth daily.        Marland Kitchen loteprednol (LOTEMAX) 0.2 % SUSP 1 drop 4 (four) times daily.        . pantoprazole (PROTONIX) 40 MG tablet TAKE 1 TABLET BY MOUTH EVERY MORNING  90 tablet  1  . prednisoLONE acetate (PRED FORTE) 1 % ophthalmic suspension 1 drop 4 (four) times daily. In right eye      . promethazine-codeine (PHENERGAN WITH CODEINE) 6.25-10 MG/5ML syrup Take 5 mLs  by mouth every 6 (six) hours as needed.        . sodium chloride (MURO 128) 5 % ophthalmic ointment Place 1 drop into the right eye at bedtime.      . travoprost, benzalkonium, (TRAVATAN) 0.004 % ophthalmic solution Place 1 drop into both eyes at bedtime.           Review of Systems System review is negative for any constitutional, cardiac, pulmonary, GI or neuro symptoms or complaints other than as described in the HPI.     Objective:   Physical Exam T 97.3 132/70  40  16  Wt Readings from Last 3 Encounters:  09/06/11 111 lb (50.349 kg)  07/24/11 109 lb (49.442 kg)  04/24/11 112 lb 4 oz (50.916 kg)   Gen'l - very elderly and frail appearing AA woman in no distress HEENT- torus palatine, C&S clear Cor- Irregular and slow heart rate, II/VI systolic murmur. Pulm - normal respirations. Neuro - awake and alert, no focal abnormalities.  12. Lead EKG - RRRB, left fasicular block, Mobitz II AVB, PVCs       Assessment & Plan:

## 2011-10-05 NOTE — Patient Instructions (Addendum)
Generalized feeling weak and nauseated - exam today is normal: good bowel sounds, not tender. Plan Lab work: blood count, chemistry panel, repeat urinalysis.  Heart - your heart rate was slow and irregular. The EKG reveals electrical conduction abnormalities (Right bundle branch block, left fasicular branch block and Mobitz II AVB). Discussed this with Dr. Antoine Poche, one of our cardiologists. Plan Lab - check on thyroid function  Schedule you to wear a heart monitor for 24 hrs - you will be called about an appointment for this ` Schedule an appointment to see a cardiologist after the monitor to see if you need any new medications or possibly a pacemaker.  Continue all your other medications.

## 2011-10-06 LAB — URINALYSIS, ROUTINE W REFLEX MICROSCOPIC
Bilirubin Urine: NEGATIVE
Hgb urine dipstick: NEGATIVE
Ketones, ur: NEGATIVE
Total Protein, Urine: NEGATIVE
Urine Glucose: NEGATIVE
Urobilinogen, UA: 0.2 (ref 0.0–1.0)

## 2011-10-06 LAB — COMPREHENSIVE METABOLIC PANEL
ALT: 40 U/L — ABNORMAL HIGH (ref 0–35)
AST: 31 U/L (ref 0–37)
Albumin: 3.5 g/dL (ref 3.5–5.2)
CO2: 25 mEq/L (ref 19–32)
Calcium: 9.4 mg/dL (ref 8.4–10.5)
Chloride: 111 mEq/L (ref 96–112)
Creatinine, Ser: 1.7 mg/dL — ABNORMAL HIGH (ref 0.4–1.2)
GFR: 36.89 mL/min — ABNORMAL LOW (ref >60.00)
Potassium: 4.3 mEq/L (ref 3.5–5.1)
Sodium: 141 mEq/L (ref 135–145)
Total Protein: 6.5 g/dL (ref 6.0–8.3)

## 2011-10-07 DIAGNOSIS — R319 Hematuria, unspecified: Secondary | ICD-10-CM | POA: Insufficient documentation

## 2011-10-07 NOTE — Assessment & Plan Note (Signed)
Stable with no evidence of decompensation   Plan- continue present regimen.

## 2011-10-07 NOTE — Assessment & Plan Note (Signed)
August 16, '13 U/A with RBC TNTC. She had minimal symptoms. Repeat U/A Aug 29th, '13- clear

## 2011-10-10 ENCOUNTER — Telehealth: Payer: Self-pay | Admitting: *Deleted

## 2011-10-10 NOTE — Telephone Encounter (Signed)
Message copied by Elnora Morrison on Tue Oct 10, 2011 10:10 AM ------      Message from: Jacques Navy      Created: Sun Oct 08, 2011  7:51 AM       Please call patient's daughter: urinalysis - normal, remainder of the lab is very stable and OK.

## 2011-10-10 NOTE — Telephone Encounter (Signed)
Left message on patient daughter, [name], of normal urinalysis and lab work is stable and ok. Message left on cell #

## 2011-10-11 ENCOUNTER — Encounter (INDEPENDENT_AMBULATORY_CARE_PROVIDER_SITE_OTHER): Payer: 59

## 2011-10-11 DIAGNOSIS — I4510 Unspecified right bundle-branch block: Secondary | ICD-10-CM

## 2011-10-11 DIAGNOSIS — I452 Bifascicular block: Secondary | ICD-10-CM

## 2011-10-13 ENCOUNTER — Inpatient Hospital Stay (HOSPITAL_COMMUNITY)
Admission: EM | Admit: 2011-10-13 | Discharge: 2011-10-17 | DRG: 243 | Disposition: A | Discharge status: Home / Self Care | Payer: Medicare Other | Attending: Cardiology | Admitting: Cardiology

## 2011-10-13 ENCOUNTER — Emergency Department (HOSPITAL_COMMUNITY): Payer: Medicare Other

## 2011-10-13 ENCOUNTER — Encounter (HOSPITAL_COMMUNITY): Payer: Self-pay | Admitting: Family Medicine

## 2011-10-13 ENCOUNTER — Telehealth: Payer: Self-pay | Admitting: Internal Medicine

## 2011-10-13 DIAGNOSIS — R911 Solitary pulmonary nodule: Secondary | ICD-10-CM | POA: Diagnosis present

## 2011-10-13 DIAGNOSIS — I10 Essential (primary) hypertension: Secondary | ICD-10-CM | POA: Diagnosis present

## 2011-10-13 DIAGNOSIS — Z66 Do not resuscitate: Secondary | ICD-10-CM | POA: Diagnosis present

## 2011-10-13 DIAGNOSIS — D649 Anemia, unspecified: Secondary | ICD-10-CM | POA: Diagnosis present

## 2011-10-13 DIAGNOSIS — Z888 Allergy status to other drugs, medicaments and biological substances status: Secondary | ICD-10-CM

## 2011-10-13 DIAGNOSIS — I4510 Unspecified right bundle-branch block: Secondary | ICD-10-CM | POA: Diagnosis present

## 2011-10-13 DIAGNOSIS — K319 Disease of stomach and duodenum, unspecified: Secondary | ICD-10-CM | POA: Diagnosis present

## 2011-10-13 DIAGNOSIS — I441 Atrioventricular block, second degree: Principal | ICD-10-CM | POA: Diagnosis present

## 2011-10-13 DIAGNOSIS — I5030 Unspecified diastolic (congestive) heart failure: Secondary | ICD-10-CM | POA: Diagnosis present

## 2011-10-13 DIAGNOSIS — R001 Bradycardia, unspecified: Secondary | ICD-10-CM | POA: Diagnosis present

## 2011-10-13 DIAGNOSIS — I509 Heart failure, unspecified: Secondary | ICD-10-CM | POA: Diagnosis present

## 2011-10-13 DIAGNOSIS — E785 Hyperlipidemia, unspecified: Secondary | ICD-10-CM | POA: Diagnosis present

## 2011-10-13 DIAGNOSIS — I498 Other specified cardiac arrhythmias: Secondary | ICD-10-CM | POA: Diagnosis present

## 2011-10-13 DIAGNOSIS — H409 Unspecified glaucoma: Secondary | ICD-10-CM | POA: Diagnosis present

## 2011-10-13 DIAGNOSIS — M810 Age-related osteoporosis without current pathological fracture: Secondary | ICD-10-CM | POA: Diagnosis present

## 2011-10-13 LAB — BASIC METABOLIC PANEL
BUN: 39 mg/dL — ABNORMAL HIGH (ref 6–23)
CO2: 24 mEq/L (ref 19–32)
Calcium: 9.7 mg/dL (ref 8.4–10.5)
Chloride: 109 mEq/L (ref 96–112)
Creatinine, Ser: 1.59 mg/dL — ABNORMAL HIGH (ref 0.50–1.10)
GFR calc Af Amer: 31 mL/min — ABNORMAL LOW (ref >90)
GFR calc non Af Amer: 27 mL/min — ABNORMAL LOW (ref >90)
Glucose, Bld: 100 mg/dL — ABNORMAL HIGH (ref 70–99)
Potassium: 4.3 mEq/L (ref 3.5–5.1)
Sodium: 142 mEq/L (ref 135–145)

## 2011-10-13 LAB — CBC
HCT: 38.8 % (ref 36.0–46.0)
Hemoglobin: 12.1 g/dL (ref 12.0–15.0)
MCH: 28.2 pg (ref 26.0–34.0)
MCHC: 31.2 g/dL (ref 30.0–36.0)
MCV: 90.4 fL (ref 78.0–100.0)
Platelets: 211 10*3/uL (ref 150–400)
RBC: 4.29 MIL/uL (ref 3.87–5.11)
RDW: 14.5 % (ref 11.5–15.5)
WBC: 7.7 10*3/uL (ref 4.0–10.5)

## 2011-10-13 MED ORDER — ACETAMINOPHEN 325 MG PO TABS: 650.0000 mg | ORAL_TABLET | ORAL | Status: DC | PRN

## 2011-10-13 MED ORDER — SODIUM CHLORIDE 0.9 % IV SOLN: 250.0000 mL | INTRAVENOUS | Status: DC | PRN

## 2011-10-13 MED ORDER — ALPRAZOLAM 0.25 MG PO TABS: 0.2500 mg | ORAL_TABLET | Freq: Two times a day (BID) | ORAL | Status: DC | PRN

## 2011-10-13 MED ORDER — NITROGLYCERIN 0.4 MG SL SUBL: 0.4000 mg | SUBLINGUAL_TABLET | SUBLINGUAL | Status: DC | PRN

## 2011-10-13 MED ORDER — SODIUM CHLORIDE 0.9 % IJ SOLN
3.0000 mL | Freq: Two times a day (BID) | INTRAMUSCULAR | Status: DC
  Administered 2011-10-13 – 2011-10-17 (×6): 3 mL via INTRAVENOUS

## 2011-10-13 MED ORDER — ONDANSETRON HCL 4 MG/2ML IJ SOLN: 4.0000 mg | Freq: Four times a day (QID) | INTRAMUSCULAR | Status: DC | PRN

## 2011-10-13 MED ORDER — ZOLPIDEM TARTRATE 5 MG PO TABS: 5.0000 mg | ORAL_TABLET | Freq: Every evening | ORAL | Status: DC | PRN

## 2011-10-13 MED ORDER — SODIUM CHLORIDE 0.9 % IJ SOLN: 3.0000 mL | INTRAMUSCULAR | Status: DC | PRN

## 2011-10-13 NOTE — H&P (Signed)
History and Physical   Patient ID: KADYNCE BONDS MRN: [id number], DOB/AGE: 1918/01/18 78 y.o. Date of Encounter: 10/13/2011  Primary Physician: Illene Regulus, MD Primary Cardiologist: None  Chief Complaint:  Bradycardia  HPI: Ms. Noyce is a 79 year old female with no previous history of coronary artery disease. She diastolic heart failure in the past, but was never seen by cardiology. She saw Dr. Debby Bud  about her weakness, and a Holter monitor showed second degree heart block. She was still complaining of weakness and was sent to the emergency room.  Ms. Ikner has had weakness for a while, duration unclear. She denies any presyncope or syncope. She has not fallen. She was already using a walker around the house. She has not had chest pain. she does not exert herself very much, but denies dyspnea on exertion, orthopnea or PND. She has some mild to help with household duties including cooking and cleaning, and so she does not walk that much anymore. In the emergency room, she complains of weakness but her blood pressure is within normal limits and her heart rate, although it slows at times because of second degree heart block (Mobitz 1) does not drop below 60. She is on a beta blocker as a component of one of her eyedrops, but she has been on it for years and there is no recent dose change. She is currently resting comfortably.  Past Medical History  Diagnosis Date  . Acute upper gastrointestinal hemorrhage 2008  . Stiffness of joint, lower leg   . Constipation   . Weakness   . Dyspepsia   . Pulmonary nodule     right upper lobe  . Anemia   . Osteoporosis   . Glaucoma   . Hypertension   . Hyperlipidemia   . CHF (congestive heart failure)     diastolic     Surgical History:  Past Surgical History  Procedure Date  . Cataract extraction   . Upper gastrointestinal endoscopy   . Colonoscopy     I have reviewed the patient's current medications. Medication Sig  atropine  1 % ophthalmic solution Place 1 drop into the right eye 2 (two) times daily.  calcium carbonate (TUMS LASTING EFFECTS) 500 MG chewable tablet Chew 1 tablet by mouth as needed.    cholestyramine (QUESTRAN) 4 G packet USE 1 PACKET IN WATER TWICE A DAY  Dorzolamide HCl-Timolol Mal (COSOPT OP) Apply to eye 2 (two) times daily.    enalapril (VASOTEC) 10 MG tablet TAKE 1 TABLET BY MOUTH EVERY DAY  furosemide (LASIX) 20 MG tablet Take 1 tablet (20 mg total) by mouth daily.  loratadine (CLARITIN) 10 MG tablet Take 10 mg by mouth daily.    loteprednol (LOTEMAX) 0.2 % SUSP 1 drop 4 (four) times daily.    pantoprazole (PROTONIX) 40 MG tablet TAKE 1 TABLET BY MOUTH EVERY MORNING  prednisoLONE acetate (PRED FORTE) 1 % ophthalmic suspension 1 drop 4 (four) times daily. In right eye  promethazine-codeine (PHENERGAN WITH CODEINE) 6.25-10 MG/5ML syrup Take 5 mLs by mouth every 6 (six) hours as needed.    sodium chloride (MURO 128) 5 % ophthalmic ointment Place 1 drop into the right eye at bedtime.  travoprost, benzalkonium, (TRAVATAN) 0.004 % ophthalmic solution Place 1 drop into both eyes at bedtime.     Allergies:  Allergies  Allergen Reactions  . Risedronate Sodium     REACTION: Diarrhea   History   Social History  . Marital Status: Widowed    Spouse Name:  N/A    Number of Children: N/A  . Years of Education: N/A   Occupational History  . Not on file.   Social History Main Topics  . Smoking status: Never Smoker   . Smokeless tobacco: Never Used  . Alcohol Use: No  . Drug Use: No  . Sexually Active: No   Other Topics Concern  . Not on file   Social History Narrative   Pt lives alone but has a supportive family and someone is with her most of the time. Both parents and all her siblings are deceased, none with cardiac issues.Marland KitchenMarland KitchenShe has a very supportive family. remains I-ADLs except for driving..  End of Life - Care: discussed with patinet and dtr: DNR. Provided end of life materials, out of  facility order and MOST form.Marland KitchenMarland KitchenDiscussed with pt and family - 10/13/2011 - pt does not want to live on a machine but is OK with being resuscitated.   Family Status  Relation Status Death Age  . Mother Deceased   . Father Deceased   . Sister Deceased   . Brother Deceased     Review of Systems: She is able to walk around, does not do housework or drive. Has problems with vision from glaucoma. She is compliant with meds. She has weakness but has not fallen. No history of syncope or SOB. Full 14-point review of systems otherwise negative except as noted above.  Physical Exam: Blood pressure 152/77, pulse 72, temperature 98.5 F (36.9 C), temperature source Oral, resp. rate 16, SpO2 99.00%. General: Well developed, well nourished, elderly female in no acute distress. Head: Normocephalic, atraumatic, sclera non-icteric, no xanthomas, nares are without discharge. Dentition: edentulous  Neck: No carotid bruits. JVD not elevated. No thyromegally Lungs: Good expansion bilaterally. without wheezes or rhonchi. Few Rales bases Heart: Slightly irregular rate and rhythm with S1 S2.  No S3 or S4.  No murmur, no rubs, or gallops appreciated. Abdomen: Soft, non-tender, non-distended with normoactive bowel sounds. No hepatomegaly. No rebound/guarding. No obvious abdominal masses. Msk:  Strength and tone appear normal  for age. No joint deformities or effusions, no spine or costo-vertebral angle tenderness. Extremities: No clubbing or cyanosis. No edema.  Distal pedal pulses are 2+ in upper extrem, slightly decreased in lower extrem Neuro: Alert and oriented X 3. Moves all extremities spontaneously. No focal deficits noted. Psych:  Responds to questions appropriately with a normal affect. Skin: No rashes or lesions noted  Labs:  Lab Results  Component Value Date   WBC 7.7 10/13/2011   HGB 12.1 10/13/2011   HCT 38.8 10/13/2011   MCV 90.4 10/13/2011   PLT 211 10/13/2011    Radiology/Studies:  Pending  ECHO:  06/17/2010 Study Conclusions - Left ventricle: There is severe concentric LVH. There is increased brightness of the LV walls. The cavity size was normal. Wall thickness was increased in a pattern of severe LVH. The estimated ejection fraction was 50%. Regional wall motion abnormalities cannot be excluded. Doppler parameters are consistent with high ventricular filling pressure. - Aortic valve: Sclerosis without stenosis. - Mitral valve: Mild regurgitation. - Left atrium: The atrium was mildly dilated. - Right ventricle: The cavity size was normal. Wall thickness was mildly increased. Systolic function was mildly reduced. - Right atrium: The atrium was mildly dilated. - Pericardium, extracardiac: A trivial pericardial effusion was identified posterior to the heart. - Impressions: There is a pleural effusion. Impressions: - There is a pleural effusion.  ECG:  13-Oct-2011 18:48:39 Moses   PACEMAKER SPIKES OR  ARTIFACTS ~ timing non-diagnostic SINUS RHYTHM ~ normal P axis, V-rate 50- 99 SUPRAVENTRICULAR BIGEMINY ~ bigeminy string>4 w/ SV complexes FIRST DEGREE AV BLOCK ~ PR >220, V-rate 50- 90 PROBABLE LEFT ATRIAL ABNORMALITY ~ P >29mS, <-0.62mV V1 IVCD, CONSIDER ATYPICAL RBBB ~ QRSd>166mS, terminal axis(90,270) RECONSTRUCTED PACER SPIKES IN LD(S) II ~ bedside recording Vent. rate 75 BPM PR interval 280 ms QRS duration 140 ms QT/QTc 540/603 ms P-R-T axes 0 -70 94  ASSESSMENT AND PLAN:  Principal Problem:  *Bradycardia - for now, we will continue all of her home medications, including her Dorzolamide HCl-Timolol Mal (COSOPT OP) eyedrops which she takes twice a day. She will be monitored on telemetry every the next 24-48 hours with a final decision on pacemaker to be made once EP sees her.  Otherwise, continue her home medications. Active Problems:  GLAUCOMA  HYPERTENSION    Signed,  Theodore Demark PA-C 10/13/2011, 7:19 PM Agree with assessment and plan as noted above.  Patient  is in no distress. Has been weak but no syncope. The strips from the 24 hour holter monitor are not presently available for review. We will admit on telemetry and observe for more serious arrhythmias. In the ER on telemetry she appears to have periods of Mobitz I. She has a good quality of life at home and would be a PPM candidate if her rhythm indicates a need.

## 2011-10-13 NOTE — ED Notes (Signed)
Dr. Patty Sermons at bedside with pt.

## 2011-10-13 NOTE — ED Notes (Signed)
Pt sent here for pacemaker placement. Dr. Camillo Flaming paged

## 2011-10-13 NOTE — ED Provider Notes (Signed)
History    78 year old female referred to the ER for evaluation of bradycardia. Patient has been having generalized fatigue and weakness recently. Had a cardiac monitor ordered by her PCP. Significant for second-degree heart block significant bradycardia. Was referred to the ER for further evaluation and possible pacemaker placement. Patient currently with no complaints. She specifically denies chest pain or shortness of breath. No dizziness or lightheadedness. Ports compliance with her medication.  CSN: [id number]  Arrival date & time 10/13/11  9528   First MD Initiated Contact with Patient 10/13/11 1830      Chief Complaint  Patient presents with  . Bradycardia    (Consider location/radiation/quality/duration/timing/severity/associated sxs/prior treatment) HPI  Past Medical History  Diagnosis Date  . Acute upper gastrointestinal hemorrhage   . Stiffness of joint, lower leg   . Constipation   . Weakness   . Dyspepsia   . Pulmonary nodule     right upper lobe  . Anemia   . Osteoporosis   . Glaucoma   . Hypertension   . Hyperlipidemia   . CHF (congestive heart failure)     diastolic    Past Surgical History  Procedure Date  . Cataract extraction     History reviewed. No pertinent family history.  History  Substance Use Topics  . Smoking status: Never Smoker   . Smokeless tobacco: Never Used  . Alcohol Use: No    OB History    Grav Para Term Preterm Abortions TAB SAB Ect Mult Living                  Review of Systems   Review of symptoms negative unless otherwise noted in HPI.   Allergies  Risedronate sodium  Home Medications   Current Outpatient Rx  Name Route Sig Dispense Refill  . ATROPINE SULFATE 1 % OP SOLN Right Eye Place 1 drop into the right eye 2 (two) times daily.    Marland Kitchen CALCIUM CARBONATE ANTACID 500 MG PO CHEW Oral Chew 1 tablet by mouth as needed.      . CHOLESTYRAMINE 4 G PO PACK  USE 1 PACKET IN WATER TWICE A DAY 60 packet 1  . COSOPT OP  Ophthalmic Apply to eye 2 (two) times daily.      . ENALAPRIL MALEATE 10 MG PO TABS  TAKE 1 TABLET BY MOUTH EVERY DAY 90 tablet 1  . FUROSEMIDE 20 MG PO TABS Oral Take 1 tablet (20 mg total) by mouth daily. 90 tablet 3  . LORATADINE 10 MG PO TABS Oral Take 10 mg by mouth daily.      Marland Kitchen LOTEPREDNOL ETABONATE 0.2 % OP SUSP  1 drop 4 (four) times daily.      Marland Kitchen PANTOPRAZOLE SODIUM 40 MG PO TBEC  TAKE 1 TABLET BY MOUTH EVERY MORNING 90 tablet 1  . PREDNISOLONE ACETATE 1 % OP SUSP  1 drop 4 (four) times daily. In right eye    . PROMETHAZINE-CODEINE 6.25-10 MG/5ML PO SYRP Oral Take 5 mLs by mouth every 6 (six) hours as needed.      . SODIUM CHLORIDE (HYPERTONIC) 5 % OP OINT Right Eye Place 1 drop into the right eye at bedtime.    . TRAVOPROST 0.004 % OP SOLN Both Eyes Place 1 drop into both eyes at bedtime.        BP 152/77  Pulse 72  Temp 98.5 F (36.9 C) (Oral)  Resp 16  SpO2 99%  Physical Exam  Nursing note and vitals reviewed. Constitutional:  She is oriented to person, place, and time. She appears well-developed and well-nourished. No distress.  HENT:  Head: Normocephalic and atraumatic.  Eyes: Conjunctivae normal are normal. Right eye exhibits no discharge. Left eye exhibits no discharge.  Neck: Neck supple.  Cardiovascular: Normal rate, regular rhythm and normal heart sounds.  Exam reveals no gallop and no friction rub.   No murmur heard. Pulmonary/Chest: Effort normal and breath sounds normal. No respiratory distress.  Abdominal: Soft. She exhibits no distension. There is no tenderness.  Musculoskeletal: She exhibits no edema and no tenderness.  Neurological: She is alert and oriented to person, place, and time. No cranial nerve deficit. She exhibits normal muscle tone. Coordination normal.  Skin: Skin is warm and dry.  Psychiatric: She has a normal mood and affect. Her behavior is normal. Thought content normal.    ED Course  Procedures (including critical care time)  Labs  Reviewed  BASIC METABOLIC PANEL - Abnormal; Notable for the following:    Glucose, Bld 100 (*)     BUN 39 (*)     Creatinine, Ser 1.59 (*)     GFR calc non Af Amer 27 (*)     GFR calc Af Amer 31 (*)     All other components within normal limits  BASIC METABOLIC PANEL - Abnormal; Notable for the following:    BUN 36 (*)     Creatinine, Ser 1.51 (*)     GFR calc non Af Amer 29 (*)     GFR calc Af Amer 33 (*)     All other components within normal limits  CBC  PROTIME-INR  APTT  TROPONIN I  TROPONIN I  TROPONIN I   No results found.   1. Bradycardia       MDM  89yF w/ fatigue and recent holter monitoring which showed significant bradycardia to 30s and 2nd degree AV block. Currently no complaints. Cardiology aware and to eval pt in ED.         Raeford Razor, MD 10/19/11 [phone]

## 2011-10-13 NOTE — Telephone Encounter (Signed)
Received call from Dr. Sanjuana Kava about mid-day: holter revealed frequent severe bradycardia with Mobitz II AVB and her recommended direct admission to cardiology for pacemaker.  Called daughter [name] and instructed her to take Mrs. Foronda to [facility] ED for direct admission. Called ED to let them know she was coming and to [facility] on arrival.

## 2011-10-13 NOTE — ED Notes (Signed)
Dr. Patty Sermons paged and states that he will be down to see patient shortly.

## 2011-10-14 DIAGNOSIS — I442 Atrioventricular block, complete: Secondary | ICD-10-CM

## 2011-10-14 LAB — TROPONIN I: Troponin I: <0.3 ng/mL (ref <0.30)

## 2011-10-14 LAB — PROTIME-INR: Prothrombin Time: 14.6 seconds (ref 11.6–15.2)

## 2011-10-14 LAB — BASIC METABOLIC PANEL
BUN: 36 mg/dL — ABNORMAL HIGH (ref 6–23)
CO2: 25 mEq/L (ref 19–32)
Calcium: 9.4 mg/dL (ref 8.4–10.5)
Glucose, Bld: 92 mg/dL (ref 70–99)
Sodium: 145 mEq/L (ref 135–145)

## 2011-10-14 LAB — APTT: aPTT: 27 seconds (ref 24–37)

## 2011-10-14 NOTE — Progress Notes (Signed)
Pt cont to have occasional episodes via tele where HR decreases to 39 and pt goes into 2 Degree heart block rhythm, non sustained, as recently seen while pt was wearing Holter monitor prior to admission. Pt is asleep at the time asymptomatic.VSS. Will continue to monitor. Benay Pike

## 2011-10-14 NOTE — Progress Notes (Signed)
Courtesy note: Reviewed tele strips, nurses notes and Dr. Lubertha Basque note. Spoke with patient and her daughter [name] - I have encouraged them to agree to [facility]. [name] will talk to her sister and brother but indicates they will most likely agree with medical recommendations.  Mrs. Dzik is doing well with no complaints.  I appreciate Dr.Taylor and teams help.

## 2011-10-14 NOTE — Progress Notes (Signed)
Patient seen in office 10/05/11. EKG with RBBB, Left anterior fasicular block and 1st degree AVB. Discussed with Dr. Antoine Poche - recommended holter. Dr. Sanjuana Kava called 10/13/11 with report that holter revealed multiple episodes of bradycardia in the 30's and he recommended immediate hospital admission to cardiology for EP consultation for PTVDP. Family called and subsequently brought Mrs. Melanie Clements to ED for admission.

## 2011-10-14 NOTE — Consult Note (Signed)
  Reason for Consult:bradycardia with high grade heart block  Referring Physician: [name] is an 78 y.o. female.   HPI: The patient is a 78 yo woman who has had episodes of dizziness and wore a cardiac monitor which demonstrated periods of daytime bradycardia with heart rates in the 30's in the setting of high grade heart block. She is on no AV nodal blocking drugs except for timolol eye drops. She has never had syncope or unexplained falls. No chest pain or sob. No/min peripheral edema. She has generalized weakness.   PMH: Past Medical History  Diagnosis Date  . Acute upper gastrointestinal hemorrhage     2008  . Stiffness of joint, lower leg   . Constipation   . Weakness   . Dyspepsia   . Pulmonary nodule     right upper lobe  . Anemia   . Osteoporosis   . Glaucoma   . Hypertension   . Hyperlipidemia   . CHF (congestive heart failure)     diastolic    PSHX: Past Surgical History  Procedure Date  . Cataract extraction   . Upper gastrointestinal endoscopy   . Colonoscopy     FAMHX:History reviewed. No pertinent family history.  Social History:  reports that she has never smoked. She has never used smokeless tobacco. She reports that she does not drink alcohol or use illicit drugs.  Allergies:  Allergies  Allergen Reactions  . Risedronate Sodium     REACTION: Diarrhea    Medications: reviewed  X-ray Chest Pa And Lateral  10/13/2011  *RADIOLOGY REPORT*  Clinical Data: Weakness.  CHEST - 2 VIEW  Comparison: 06/22/2010 and chest CT 08/03/2009  Findings: Stable moderate cardiomegaly.  Small bilateral pleural effusions.  Mild pulmonary vascular congestion without definite edema.  Bilateral nipple shadows noted.  No acute bony abnormality is identified.  IMPRESSION: Moderate cardiomegaly and mild pulmonary vascular congestion with small bilateral pleural effusions.   Original Report Authenticated By: Britta Mccreedy, M.D.     ROS  As stated in the HPI  and negative for all other systems.  Physical Exam  Vitals:Blood pressure 117/63, pulse 43, temperature 98.5 F (36.9 C), temperature source Oral, resp. rate 18, height 5\' 7"  (1.702 m), weight 111 lb 12.8 oz (50.712 kg), SpO2 100.00%.  Well appearing elderly woman, looking younger than her stated age, NAD HEENT: Unremarkable Neck:  No JVD, no thyromegally Lungs:  Clear with no wheezes, rales, or rhonchi HEART:  IRegular bradycardic rhythm, no murmurs, no rubs, no clicks Abd:  soft, positive bowel sounds, no organomegally, no rebound, no guarding Ext:  2 plus pulses, no edema, no cyanosis, no clubbing Skin:  No rashes no nodules Neuro:  CN II through XII intact, motor grossly intact   Tele - NSR with 2:1 heart block, AVWB, and RBBB.  Assessment/Plan: 1. High grade heart block, minimally symptomatic 2. HTN 3. Dyslipidemia Rec: I think PPM insertion is indicated. I have discussed the risks/benefits/ of the procedure and she would like to discuss with her family and I concur. Would tentatively plan PPM on Monday if she is willing.  Sharlot Gowda TaylorMD 10/14/2011, 10:20 AM

## 2011-10-15 MED ORDER — SODIUM CHLORIDE 0.9 % IV SOLN: INTRAVENOUS | Status: DC

## 2011-10-15 MED ORDER — CEFAZOLIN SODIUM-DEXTROSE 2-3 GM-% IV SOLR
2.0000 g | INTRAVENOUS | Status: DC
  Filled 2011-10-15: qty 50

## 2011-10-15 MED ORDER — SODIUM CHLORIDE 0.9 % IR SOLN
80.0000 mg | Status: DC
  Filled 2011-10-15: qty 2

## 2011-10-15 MED ORDER — CHLORHEXIDINE GLUCONATE 4 % EX LIQD
60.0000 mL | Freq: Once | CUTANEOUS | Status: AC
  Administered 2011-10-15: 4 via TOPICAL
  Filled 2011-10-15: qty 60

## 2011-10-15 MED ORDER — CHLORHEXIDINE GLUCONATE 4 % EX LIQD
60.0000 mL | Freq: Once | CUTANEOUS | Status: AC
  Administered 2011-10-16: 4 via TOPICAL
  Filled 2011-10-15: qty 60

## 2011-10-15 NOTE — Progress Notes (Signed)
Patient ID: Melanie Clements, female   DOB: 12/21/1917, 78 y.o.   MRN: [id number] Subjective:  No chest pain, sob, or syncope  Objective:  Vital Signs in the last 24 hours: Temp:  [98.4 F (36.9 C)-99 F (37.2 C)] 98.4 F (36.9 C) (09/08 0540) Pulse Rate:  [42-55] 42  (09/08 0540) Resp:  [18-19] 18  (09/08 0540) BP: (108-149)/(54-61) 125/54 mmHg (09/08 0540) SpO2:  [99 %-100 %] 99 % (09/08 0540) Weight:  [112 lb 6.4 oz (50.984 kg)] 112 lb 6.4 oz (50.984 kg) (09/08 0540)  Intake/Output from previous day: 09/07 0701 - 09/08 0700 In: 340 [P.O.:340] Out: 1000 [Urine:1000] Intake/Output from this shift:    Physical Exam: Well appearing elderly woman, NAD HEENT: Unremarkable Neck:  No JVD, no thyromegally Lungs:  Clear with no wheezes, rales or rhonchi HEART:  IRegular brady rhythm, no murmurs, no rubs, no clicks Abd:  Flat, positive bowel sounds, no organomegally, no rebound, no guarding Ext:  2 plus pulses, no edema, no cyanosis, no clubbing Skin:  No rashes no nodules Neuro:  CN II through XII intact, motor grossly intact  Lab Results:  Basename 10/13/11 1847  WBC 7.7  HGB 12.1  PLT 211    Basename 10/14/11 0645 10/13/11 1847  NA 145 142  K 3.6 4.3  CL 109 109  CO2 25 24  GLUCOSE 92 100*  BUN 36* 39*  CREATININE 1.51* 1.59*    Basename 10/14/11 1223 10/14/11 0645  TROPONINI <0.30 <0.30   Hepatic Function Panel No results found for this basename: PROT,ALBUMIN,AST,ALT,ALKPHOS,BILITOT,BILIDIR,IBILI in the last 72 hours No results found for this basename: CHOL in the last 72 hours No results found for this basename: PROTIME in the last 72 hours  Imaging: X-ray Chest Pa And Lateral  10/13/2011  *RADIOLOGY REPORT*  Clinical Data: Weakness.  CHEST - 2 VIEW  Comparison: 06/22/2010 and chest CT 08/03/2009  Findings: Stable moderate cardiomegaly.  Small bilateral pleural effusions.  Mild pulmonary vascular congestion without definite edema.  Bilateral nipple shadows  noted.  No acute bony abnormality is identified.  IMPRESSION: Moderate cardiomegaly and mild pulmonary vascular congestion with small bilateral pleural effusions.   Original Report Authenticated By: SUSAN TURNER, M.D.     Cardiac Studies: Tele - NSR with high grade heart block Assessment/Plan:  1. High grade heart block 2. HTN Rec: I have discussed the treatment options with the patient and have spoke to her daughter. We agree that PPM is indicated. I have discussed the risks/benefits/goals/expectations of the procedure with the patient and she wishes to proceed.  LOS: 2 days    Gregg TaylorM.D. 10/15/2011, 8:44 AM     

## 2011-10-16 ENCOUNTER — Encounter (HOSPITAL_COMMUNITY)
Admission: EM | Disposition: A | Discharge status: Home / Self Care | Payer: Self-pay | Source: Home / Self Care | Attending: Cardiology

## 2011-10-16 DIAGNOSIS — I442 Atrioventricular block, complete: Secondary | ICD-10-CM

## 2011-10-16 HISTORY — PX: PERMANENT PACEMAKER INSERTION: SHX5480

## 2011-10-16 HISTORY — PX: PACEMAKER INSERTION: SHX728

## 2011-10-16 SURGERY — PERMANENT PACEMAKER INSERTION
Anesthesia: Moderate Sedation | Laterality: Left

## 2011-10-16 MED ORDER — SODIUM CHLORIDE 0.9 % IV SOLN
INTRAVENOUS | Status: DC
  Administered 2011-10-16: 15:00:00 via INTRAVENOUS

## 2011-10-16 MED ORDER — PANTOPRAZOLE SODIUM 40 MG PO TBEC
40.0000 mg | DELAYED_RELEASE_TABLET | Freq: Every day | ORAL | Status: DC
Start: 2011-10-16 — End: 2011-10-17
  Administered 2011-10-16 – 2011-10-17 (×2): 40 mg via ORAL
  Filled 2011-10-16 (×2): qty 1

## 2011-10-16 MED ORDER — SODIUM CHLORIDE (HYPERTONIC) 5 % OP OINT
TOPICAL_OINTMENT | Freq: Every day | OPHTHALMIC | Status: DC
  Filled 2011-10-16: qty 3.5

## 2011-10-16 MED ORDER — CEFAZOLIN SODIUM 1-5 GM-% IV SOLN
1.0000 g | Freq: Two times a day (BID) | INTRAVENOUS | Status: AC
  Administered 2011-10-16 – 2011-10-17 (×2): 1 g via INTRAVENOUS
  Filled 2011-10-16 (×2): qty 50

## 2011-10-16 MED ORDER — SODIUM CHLORIDE 0.9 % IR SOLN
Status: AC
  Administered 2011-10-16: 12:00:00
  Filled 2011-10-16 (×2): qty 2

## 2011-10-16 MED ORDER — FUROSEMIDE 20 MG PO TABS
20.0000 mg | ORAL_TABLET | Freq: Every day | ORAL | Status: DC
  Administered 2011-10-16 – 2011-10-17 (×2): 20 mg via ORAL
  Filled 2011-10-16 (×2): qty 1

## 2011-10-16 MED ORDER — LOTEPREDNOL ETABONATE 0.2 % OP SUSP: 1.0000 [drp] | Freq: Four times a day (QID) | OPHTHALMIC | Status: DC

## 2011-10-16 MED ORDER — CHOLESTYRAMINE 4 G PO PACK
1.0000 | PACK | Freq: Two times a day (BID) | ORAL | Status: DC
  Filled 2011-10-16 (×4): qty 1

## 2011-10-16 MED ORDER — PREDNISOLONE ACETATE 1 % OP SUSP
1.0000 [drp] | Freq: Four times a day (QID) | OPHTHALMIC | Status: DC
  Administered 2011-10-16 – 2011-10-17 (×5): 1 [drp] via OPHTHALMIC
  Filled 2011-10-16: qty 1

## 2011-10-16 MED ORDER — ACETAMINOPHEN 325 MG PO TABS: 325.0000 mg | ORAL_TABLET | ORAL | Status: DC | PRN

## 2011-10-16 MED ORDER — SODIUM CHLORIDE (HYPERTONIC) 5 % OP SOLN
1.0000 [drp] | Freq: Every day | OPHTHALMIC | Status: DC
  Administered 2011-10-16: 1 [drp] via OPHTHALMIC
  Filled 2011-10-16: qty 15

## 2011-10-16 MED ORDER — ATROPINE SULFATE 1 % OP SOLN
1.0000 [drp] | Freq: Two times a day (BID) | OPHTHALMIC | Status: DC
  Administered 2011-10-16 – 2011-10-17 (×2): 1 [drp] via OPHTHALMIC
  Filled 2011-10-16: qty 2

## 2011-10-16 MED ORDER — ENALAPRIL MALEATE 10 MG PO TABS
10.0000 mg | ORAL_TABLET | Freq: Every day | ORAL | Status: DC
  Administered 2011-10-16 – 2011-10-17 (×2): 10 mg via ORAL
  Filled 2011-10-16 (×2): qty 1

## 2011-10-16 MED ORDER — ONDANSETRON HCL 4 MG/2ML IJ SOLN: 4.0000 mg | Freq: Four times a day (QID) | INTRAMUSCULAR | Status: DC | PRN

## 2011-10-16 MED ORDER — TRAVOPROST (BAK FREE) 0.004 % OP SOLN
1.0000 [drp] | Freq: Every day | OPHTHALMIC | Status: DC
  Administered 2011-10-16: 1 [drp] via OPHTHALMIC
  Filled 2011-10-16: qty 2.5

## 2011-10-16 MED ORDER — TRAVOPROST 0.004 % OP SOLN: 1.0000 [drp] | Freq: Every day | OPHTHALMIC | Status: DC

## 2011-10-16 MED ORDER — ENSURE PUDDING PO PUDG
1.0000 | Freq: Three times a day (TID) | ORAL | Status: DC
  Administered 2011-10-16 – 2011-10-17 (×4): 1 via ORAL

## 2011-10-16 MED ORDER — LOTEPREDNOL ETABONATE 0.5 % OP SUSP
1.0000 [drp] | Freq: Four times a day (QID) | OPHTHALMIC | Status: DC
  Administered 2011-10-16 – 2011-10-17 (×4): 1 [drp] via OPHTHALMIC
  Filled 2011-10-16: qty 5

## 2011-10-16 MED ORDER — LORATADINE 10 MG PO TABS
10.0000 mg | ORAL_TABLET | Freq: Every day | ORAL | Status: DC
  Administered 2011-10-16 – 2011-10-17 (×2): 10 mg via ORAL
  Filled 2011-10-16 (×2): qty 1

## 2011-10-16 MED ORDER — DORZOLAMIDE HCL-TIMOLOL MAL 2-0.5 % OP SOLN
1.0000 [drp] | Freq: Two times a day (BID) | OPHTHALMIC | Status: DC
  Administered 2011-10-16 – 2011-10-17 (×2): 1 [drp] via OPHTHALMIC
  Filled 2011-10-16: qty 10

## 2011-10-16 NOTE — H&P (View-Only) (Signed)
Patient ID: Melanie Clements, female   DOB: [birth year], 78 y.o.   MRN: [id number] Subjective:  No chest pain, sob, or syncope  Objective:  Vital Signs in the last 24 hours: Temp:  [98.4 F (36.9 C)-99 F (37.2 C)] 98.4 F (36.9 C) (09/08 0540) Pulse Rate:  [42-55] 42  (09/08 0540) Resp:  [18-19] 18  (09/08 0540) BP: (108-149)/(54-61) 125/54 mmHg (09/08 0540) SpO2:  [99 %-100 %] 99 % (09/08 0540) Weight:  [112 lb 6.4 oz (50.984 kg)] 112 lb 6.4 oz (50.984 kg) (09/08 0540)  Intake/Output from previous day: 09/07 0701 - 09/08 0700 In: 340 [P.O.:340] Out: 1000 [Urine:1000] Intake/Output from this shift:    Physical Exam: Well appearing elderly woman, NAD HEENT: Unremarkable Neck:  No JVD, no thyromegally Lungs:  Clear with no wheezes, rales or rhonchi HEART:  IRegular brady rhythm, no murmurs, no rubs, no clicks Abd:  Flat, positive bowel sounds, no organomegally, no rebound, no guarding Ext:  2 plus pulses, no edema, no cyanosis, no clubbing Skin:  No rashes no nodules Neuro:  CN II through XII intact, motor grossly intact  Lab Results:  [facility] 10/13/11 1847  WBC 7.7  HGB 12.1  PLT 211    Basename 10/14/11 0645 10/13/11 1847  NA 145 142  K 3.6 4.3  CL 109 109  CO2 25 24  GLUCOSE 92 100*  BUN 36* 39*  CREATININE 1.51* 1.59*    Basename 10/14/11 1223 10/14/11 0645  TROPONINI <0.30 <0.30   Hepatic Function Panel No results found for this basename: PROT,ALBUMIN,AST,ALT,ALKPHOS,BILITOT,BILIDIR,IBILI in the last 72 hours No results found for this basename: CHOL in the last 72 hours No results found for this basename: PROTIME in the last 72 hours  Imaging: X-ray Chest Pa And Lateral  10/13/2011  *RADIOLOGY REPORT*  Clinical Data: Weakness.  CHEST - 2 VIEW  Comparison: 06/22/2010 and chest CT 08/03/2009  Findings: Stable moderate cardiomegaly.  Small bilateral pleural effusions.  Mild pulmonary vascular congestion without definite edema.  Bilateral nipple shadows  noted.  No acute bony abnormality is identified.  IMPRESSION: Moderate cardiomegaly and mild pulmonary vascular congestion with small bilateral pleural effusions.   Original Report Authenticated By: Britta Mccreedy, M.D.     Cardiac Studies: Tele - NSR with high grade heart block Assessment/Plan:  1. High grade heart block 2. HTN Rec: I have discussed the treatment options with the patient and have spoke to her daughter. We agree that PPM is indicated. I have discussed the risks/benefits/goals/expectations of the procedure with the patient and she wishes to proceed.  LOS: 2 days    Buel Ream.D. 10/15/2011, 8:44 AM

## 2011-10-16 NOTE — Progress Notes (Signed)
Cosign for Jeremy Johann RN's assessment, I/O, med administration, note(s), and care plan/education.  Lorretta Harp RN

## 2011-10-16 NOTE — Progress Notes (Signed)
INITIAL ADULT NUTRITION ASSESSMENT Date: 10/16/2011   Time: 3:38 PM Reason for Assessment: Health History   INTERVENTION: 1. Ensure Pudding po TID, each supplement provides 170 kcal and 4 grams of protein.  2. RD will continue to follow   DOCUMENTATION CODES Per approved criteria  -Underweight    ASSESSMENT: Female 78 y.o.  Dx: Bradycardia  Hx:  Past Medical History  Diagnosis Date  . Acute upper gastrointestinal hemorrhage     2008  . Stiffness of joint, lower leg   . Constipation   . Weakness   . Dyspepsia   . Pulmonary nodule     right upper lobe  . Anemia   . Osteoporosis   . Glaucoma   . Hypertension   . Hyperlipidemia   . CHF (congestive heart failure)     diastolic    Related Meds:     . atropine  1 drop Right Eye BID  .  ceFAZolin (ANCEF) IV  1 g Intravenous q12n4p  . chlorhexidine  60 mL Topical Once  . chlorhexidine  60 mL Topical Once  . cholestyramine  1 packet Oral BID WC  . dorzolamide-timolol  1 drop Both Eyes BID  . enalapril  10 mg Oral Daily  . furosemide  20 mg Oral Daily  . gentamicin irrigation   Irrigation To Cath  . loratadine  10 mg Oral Daily  . loteprednol  1 drop Both Eyes QID  . pantoprazole  40 mg Oral Daily  . prednisoLONE acetate  1 drop Right Eye QID  . sodium chloride   Right Eye QHS  . sodium chloride  3 mL Intravenous Q12H  . Travoprost (BAK Free)  1 drop Both Eyes QHS  . DISCONTD:  ceFAZolin (ANCEF) IV  2 g Intravenous On Call  . DISCONTD: gentamicin irrigation  80 mg Irrigation On Call  . DISCONTD: loteprednol  1 drop Both Eyes QID  . DISCONTD: travoprost (benzalkonium)  1 drop Both Eyes QHS     Ht: 5\' 7"  (170.2 cm)  Wt: 112 lb 12.8 oz (51.166 kg) (scale b)  Ideal Wt: 61.4 kg  % Ideal Wt: 82%  Usual Wt: 120 lbs, about 1 year ago per pt report Wt Readings from Last 10 Encounters:  10/16/11 112 lb 12.8 oz (51.166 kg)  10/16/11 112 lb 12.8 oz (51.166 kg)  09/06/11 111 lb (50.349 kg)  07/24/11 109 lb (49.442  kg)  04/24/11 112 lb 4 oz (50.916 kg)  01/24/11 113 lb (51.256 kg)  11/22/10 114 lb (51.71 kg)  09/20/10 113 lb (51.256 kg)  07/19/10 113 lb (51.256 kg)  06/28/10 115 lb (52.164 kg)    % Usual Wt: 93%  Body mass index is 17.67 kg/(m^2). Pt is underweight per current BMI    Food/Nutrition Related Hx: Pt reports she lost weight with decreased appetite about 1 year ago.   Labs:  CMP     Component Value Date/Time   NA 145 10/14/2011 0645   K 3.6 10/14/2011 0645   CL 109 10/14/2011 0645   CO2 25 10/14/2011 0645   GLUCOSE 92 10/14/2011 0645   BUN 36* 10/14/2011 0645   CREATININE 1.51* 10/14/2011 0645   CALCIUM 9.4 10/14/2011 0645   PROT 6.5 10/05/2011 1715   ALBUMIN 3.5 10/05/2011 1715   AST 31 10/05/2011 1715   ALT 40* 10/05/2011 1715   ALKPHOS 88 10/05/2011 1715   BILITOT 0.3 10/05/2011 1715   GFRNONAA 29* 10/14/2011 0645   GFRAA 33* 10/14/2011 0645  Intake/Output Summary (Last 24 hours) at 10/16/11 1540 Last data filed at 10/16/11 1513  Gross per 24 hour  Intake    460 ml  Output    850 ml  Net   -390 ml     Diet Order: Cardiac  Supplements/Tube Feeding: none  IVF:    sodium chloride Last Rate: 50 mL/hr at 10/16/11 1522  DISCONTD: sodium chloride     Estimated Nutritional Needs:   Kcal: 1400-1600 Protein: 60-70 gm  Fluid:  1.5-1.6 L   Pt was admitted for PPM implantation. Pt is s/p surgery at this time.  Per her report, when she found out she had CHF, she was told to restrict her fluids. She then was limiting diet as well to avoid increased fluid. Pt reports low weight al her life, "but not this small." Weight hx on file does not show 120 lb UBW though.  Pt appears thin with wasting noted at clavicles. Weight loss is unsupported, and intake unclear. Pt likely with some mild malnutrition but unable to dx at this time. RD will continue to follow and dx as appropriate.  Pt is willing to take Ensure pudding TID for increased protein and kcal intake, while limiting fluids.    NUTRITION DIAGNOSIS: Underweight  RELATED TO: limited intake of foods and fluids  AS EVIDENCE BY: Body mass index is 17.67 kg/(m^2).   MONITORING/EVALUATION(Goals): Goal: PO intake to meet 90-100% estimated nutrition needs Monitor: PO intake, weight, labs, education needs  EDUCATION NEEDS: -No education needs identified at this time  Clarene Duke RD, LDN Pager [phone] After Hours pager [phone]  10/16/2011, 3:38 PM

## 2011-10-16 NOTE — Progress Notes (Signed)
Orthopedic Tech Progress Note Patient Details:  Melanie Clements 27-Dec-1917 [id number]  Patient ID: Melanie Clements, female   DOB: 10-19-1917, 78 y.o.   MRN: [id number] Confirmed pt has arm sling on left UE.   Ah Bott T 10/16/2011, 2:58 PM

## 2011-10-16 NOTE — Progress Notes (Signed)
Pt had 8 beat run ov Vtach, longest this admission. In to assess pt and noted asymptomatic with vitals noted 131/49BP, 87P, 18R, 98.8T, 100% O2 sats. Dr. Charm Barges notified and no new orders given.

## 2011-10-16 NOTE — Interval H&P Note (Signed)
History and Physical Interval Note:  10/16/2011 7:46 AM  Melanie Clements  has presented today for surgery, with the diagnosis of symptomatic bradycardia  The various methods of treatment have been discussed with the patient and family. After consideration of risks, benefits and other options for treatment, the patient has consented to  Procedure(s) (LRB) with comments: PERMANENT PACEMAKER INSERTION (Left) as a surgical intervention .  The patient's history has been reviewed, patient examined, no change in status, stable for surgery.  I have reviewed the patient's chart and labs.  Questions were answered to the patient's satisfaction.     Sherryl Manges

## 2011-10-16 NOTE — Progress Notes (Signed)
Pt stated that she would sign consent when family got here. Family expected to arrive around 8:30am.

## 2011-10-16 NOTE — CV Procedure (Signed)
Melanie Clements [id number]  [id number]  Preop Dx:CHB Postop Dx same/  Cx: none apparent   Procedure single pacemaker implantation  After routine prep and drape, lidocaine was infiltrated in the prepectoral subclavicular region on the left side an incision was made and carried down to later the prepectoral fascia using electrocautery and sharp dissection a pocket was formed similarly. Hemostasis was obtained.  After this, we turned our attention to gaining accessm to the extrathoracic,left subclavian vein. This was accomplished without difficulty and without the aspiration of air or puncture of the artery. 2 separate venipunctures were accomplished; guidewires were placed and retained and sequentially 7 French sheath through which were  passed an [facility] ventricular lead serial number [id number]   .  The ventricular lead was manipulated to the right ventricular apex with a bipolar R wave was 8.2,  the pacing impedance was 482, the threshold was 0.7 @ 0.5 msec  Current at threshold was   1.1  Ma   The lead was affixed to the prepectoral fascia and attached to a  [employer] Jude  pulse generator  5620 Serial number [id number] .  Hemostasis was obtained. The pocket was copiously irrigated with antibiotic containing saline solution. The leads and the pulse generator were placed in the pocket and affixed to the prepectoral fascia. The wound iwas then closed in 2  layers in normal fashion. The wound was washed dried and a benzoin Steri-Strip dressing  was applied.  Needle count, sponge count  and instrument counts were correct at the end of the procedure .Marland Kitchen The patient tolerated the procedure without apparent complication.

## 2011-10-16 NOTE — Interval H&P Note (Signed)
History and Physical Interval Note:  10/16/2011 9:27 AM  Melanie Clements  has presented today for surgery, with the diagnosis of symptomatic bradycardia  The various methods of treatment have been discussed with the patient and family. After consideration of risks, benefits and other options for treatment, the patient has consented to  Procedure(s) (LRB) with comments: PERMANENT PACEMAKER INSERTION (Left) as a surgical intervention .  The patient's history has been reviewed, patient examined, no change in status, stable for surgery.  I have reviewed the patient's chart and labs.  Questions were answered to the patient's satisfaction.     Sherryl Manges  The benefits and risks were reviewed including but not limited to death,  perforation, infection, lead dislodgement and device malfunction.  The patient understands agrees and is willing to proceed.

## 2011-10-17 ENCOUNTER — Inpatient Hospital Stay (HOSPITAL_COMMUNITY): Payer: Medicare Other

## 2011-10-17 NOTE — Progress Notes (Signed)
   ELECTROPHYSIOLOGY ROUNDING NOTE    Patient Name: Melanie Clements Date of Encounter: 10-17-2011    SUBJECTIVE:Patient feels well.  No chest pain or shortness of breath.  Minimal incisional soreness.  Status post implantation of a single chamber pacemaker 10-16-2011.  TELEMETRY: Reviewed telemetry pt in sinus rhythm with intermittent ventricular pacing, short run NSVT  Physical Exam: Filed Vitals:   10/16/11 1501 10/16/11 2037 10/17/11 0509 10/17/11 0900  BP: 137/51 152/51 134/83 118/52  Pulse: 64 59 60 59  Temp: 98.2 F (36.8 C) 98.5 F (36.9 C) 98.4 F (36.9 C)   TempSrc: Oral Oral Oral   Resp: 18 19 19    Height:      Weight:   114 lb 3.2 oz (51.8 kg)   SpO2: 99% 100% 98% 97%    GEN- The patient is elderly appearing, alert and oriented x 3 today.   Head- normocephalic, atraumatic Eyes-  Sclera clear, conjunctiva pink Ears- hearing intact Oropharynx- clear Lungs- Clear to ausculation bilaterally, normal work of breathing Chest- pacemaker pocket is without hematoma Heart- Regular rate and rhythm, no murmurs, rubs or gallops, PMI not laterally displaced GI- soft, NT, ND, + BS Extremities- no clubbing, cyanosis, or edema  Pacemaker interrogation- reviewed in detail today    Radiology/Studies:  No ptx, leads in stable position.  DEVICE INTERROGATION: Device interrogated by industry.  Lead values including impedence, sensing, threshold within normal values.  Patient with ventricular rate in the 40's  Wound care, arm mobility, restrictions reviewed with patient.    Routine follow up scheduled.  Assessment and Plan: 1. High grade AV block Doing well s/p PPM DC to home today Routine wound care and follow-up

## 2011-10-17 NOTE — Progress Notes (Signed)
Courtesy note  Melanie Clements post op # 1 after PPM placement. Doing well. She has walked and has less SOB/DOE. Anticipate d/c home soon.  Thanks

## 2011-10-17 NOTE — Discharge Summary (Signed)
ELECTROPHYSIOLOGY PROCEDURE DISCHARGE SUMMARY    Patient ID: Melanie Clements,  MRN: [id number], DOB/AGE: [birth year] 78 y.o.  Admit date: 10/13/2011 Discharge date: 10/17/2011  Primary Care Physician: Illene Regulus, MD Primary Cardiologist: Sherryl Manges, MD (new this admission)  Primary Discharge Diagnosis:  Symptomatic bradycardia and heart block status post pacemaker implantation this admission.   Secondary Discharge Diagnosis:  1.  GI bleed- 2008 2.  Dyspepsia 3.  RUL pulmonary nodule 4.  Anemia 5.  Osteoporosis 6.  Glaucoma 7.  Hypertension 8.  Hyperlipidemia  Procedures This Admission:  1.  Implantation of a single chamber pacemaker on 10-16-2011 by Dr Graciela Husbands.  The patient received a STJ model number 5620 pacemaker with model number 1948 right ventricular lead.  There were no early apparent complications. 2.  CXR on 10-17-2011 demonstrated no pneumothorax status post pacemaker implantation.   Brief HPI: Melanie Clements is a 78 year old female with no previous history of coronary artery disease. She diastolic heart failure in the past, but was never seen by cardiology. She saw Dr. Debby Bud about her weakness, and a Holter monitor showed second degree heart block. She was still complaining of weakness and was sent to the emergency room.  Melanie Clements has had weakness for a while, duration unclear. She denies any presyncope or syncope. She has not fallen. She was already using a walker around the house. She has not had chest pain. she does not exert herself very much, but denies dyspnea on exertion, orthopnea or PND. She has some mild to help with household duties including cooking and cleaning, and so she does not walk that much anymore. In the emergency room, she complains of weakness but her blood pressure is within normal limits and her heart rate, although it slows at times because of second degree heart block (Mobitz 1) does not drop below 60. She is on a beta blocker as a component  of one of her eyedrops, but she has been on it for years and there is no recent dose change. She is currently resting comfortably.  Hospital Course:  The patient was admitted and underwent implantation of a single chamber pacemaker with details as outlined above.   He was monitored on telemetry overnight which demonstrated sinus rhythm with intermittent ventricular pacing.  Left chest was without hematoma or ecchymosis.  The device was interrogated and found to be functioning normally.  CXR was obtained and demonstrated no pneumothorax status post device implantation.  Wound care, arm mobility, and restrictions were reviewed with the patient.  Dr Johney Frame examined the patient and considered them stable for discharge to home.    Discharge Vitals: Blood pressure 134/83, pulse 60, temperature 98.4 F (36.9 C), temperature source Oral, resp. rate 19, height 5\' 7"  (1.702 m), weight 114 lb 3.2 oz (51.8 kg), SpO2 98.00%.    Labs:   Lab Results  Component Value Date   WBC 7.7 10/13/2011   HGB 12.1 10/13/2011   HCT 38.8 10/13/2011   MCV 90.4 10/13/2011   PLT 211 10/13/2011    Lab 10/14/11 0645  NA 145  K 3.6  CL 109  CO2 25  BUN 36*  CREATININE 1.51*  CALCIUM 9.4  PROT --  BILITOT --  ALKPHOS --  ALT --  AST --  GLUCOSE 92    Discharge Medications:  Medication List  As of 10/17/2011  8:36 AM   TAKE these medications         atropine 1 % ophthalmic solution   Place  1 drop into the right eye 2 (two) times daily.      cholestyramine 4 G packet   Commonly known as: QUESTRAN   Take 1 packet by mouth 2 (two) times daily with a meal.      COSOPT OP   Place 1 drop into both eyes 2 (two) times daily.      enalapril 10 MG tablet   Commonly known as: VASOTEC   TAKE 1 TABLET BY MOUTH EVERY DAY      furosemide 20 MG tablet   Commonly known as: LASIX   Take 1 tablet (20 mg total) by mouth daily.      loratadine 10 MG tablet   Commonly known as: CLARITIN   Take 10 mg by mouth daily.       loteprednol 0.2 % Susp   Commonly known as: LOTEMAX   1 drop 4 (four) times daily.      pantoprazole 40 MG tablet   Commonly known as: PROTONIX   Take 40 mg by mouth daily.      prednisoLONE acetate 1 % ophthalmic suspension   Commonly known as: PRED FORTE   1 drop 4 (four) times daily. In right eye      promethazine-codeine 6.25-10 MG/5ML syrup   Commonly known as: PHENERGAN with CODEINE   Take 5 mLs by mouth every 6 (six) hours as needed. For cough      sodium chloride 5 % ophthalmic ointment   Commonly known as: MURO 128   Place 1 drop into the right eye at bedtime.      travoprost (benzalkonium) 0.004 % ophthalmic solution   Commonly known as: TRAVATAN   Place 1 drop into both eyes at bedtime.      TUMS LASTING EFFECTS 500 MG chewable tablet   Generic drug: calcium carbonate   Chew 1 tablet by mouth 2 (two) times daily as needed. For heartburn            Disposition:  Discharge Orders    Future Appointments: Provider: Department: Dept Phone: Center:   10/23/2011 1:00 PM Jacques Navy, MD [facility] [phone] [facility]   10/26/2011 12:30 PM Minda Meo, PA-C [facility] [phone] [facility]   01/16/2012 9:30 AM Duke Salvia, MD [facility] [phone] [facility]     Follow-up Information    Follow up with Rick Duff, PA-C on 10/26/2011. (At 12:30 PM for wound check)    Contact information:   [facility] [address] [place] [address] [phone]       Follow up with Sherryl Manges, MD on 01/16/2012. (At 9:30 AM)    Contact information:   [facility] [address] [place] [address] [phone]          Duration of Discharge Encounter: Greater than 30 minutes including physician time.  Signed, Gypsy Balsam, RN, BSN 10/17/2011, 8:36 AM   I have seen, examined the patient, and reviewed the above assessment and plan.  Changes to above  are made where necessary.    Co Sign: Hillis Range, MD 10/17/2011 1:07 PM

## 2011-10-17 NOTE — Progress Notes (Signed)
1630 discharge instructions given to pt and daughter . Both verbalized understanding . Dressing removed from S/p pacemaker site to L S/c as ordered exposing steristrip to the site  . Dry and intact neurovascular assessement intact . Kept sling in place at L arm. Discharged  Via wheelchair with family . Pt pain free

## 2011-10-18 ENCOUNTER — Encounter: Payer: Self-pay | Admitting: *Deleted

## 2011-10-18 DIAGNOSIS — Z950 Presence of cardiac pacemaker: Secondary | ICD-10-CM | POA: Insufficient documentation

## 2011-10-18 HISTORY — DX: Presence of cardiac pacemaker: Z95.0

## 2011-10-19 ENCOUNTER — Institutional Professional Consult (permissible substitution): Payer: 59 | Admitting: Cardiovascular Disease

## 2011-10-23 ENCOUNTER — Encounter: Payer: Self-pay | Admitting: Internal Medicine

## 2011-10-23 ENCOUNTER — Ambulatory Visit (INDEPENDENT_AMBULATORY_CARE_PROVIDER_SITE_OTHER): Payer: 59 | Admitting: Internal Medicine

## 2011-10-23 VITALS — BP 124/60 | HR 61 | Temp 97.0°F | Resp 14 | Wt 114.0 lb

## 2011-10-23 DIAGNOSIS — Z23 Encounter for immunization: Secondary | ICD-10-CM

## 2011-10-23 DIAGNOSIS — I452 Bifascicular block: Secondary | ICD-10-CM

## 2011-10-23 NOTE — Patient Instructions (Addendum)
Pacemaker placement - looks fine. Keep your follow-up appointment at the pacemaker clinic.  For runny nose try nasonex 2 sprays to each nostril once a day. This is a nasal steroid to help control the drippy nose.  Allergic Rhinitis Allergic rhinitis is when the mucous membranes in the nose respond to allergens. Allergens are particles in the air that cause your body to have an allergic reaction. This causes you to release allergic antibodies. Through a chain of events, these eventually cause you to release histamine into the blood stream (hence the use of antihistamines). Although meant to be protective to the body, it is this release that causes your discomfort, such as frequent sneezing, congestion and an itchy runny nose.   CAUSES   The pollen allergens may come from grasses, trees, and weeds. This is seasonal allergic rhinitis, or "hay fever." Other allergens cause year-round allergic rhinitis (perennial allergic rhinitis) such as house dust mite allergen, pet dander and mold spores.   SYMPTOMS    Nasal stuffiness (congestion).   Runny, itchy nose with sneezing and tearing of the eyes.   There is often an itching of the mouth, eyes and ears.  It cannot be cured, but it can be controlled with medications. DIAGNOSIS   If you are unable to determine the offending allergen, skin or blood testing may find it. TREATMENT    Avoid the allergen.   Medications and allergy shots (immunotherapy) can help.   Hay fever may often be treated with antihistamines in pill or nasal spray forms. Antihistamines block the effects of histamine. There are over-the-counter medicines that may help with nasal congestion and swelling around the eyes. Check with your caregiver before taking or giving this medicine.  If the treatment above does not work, there are many new medications your caregiver can prescribe. Stronger medications may be used if initial measures are ineffective. Desensitizing injections can be  used if medications and avoidance fails. Desensitization is when a patient is given ongoing shots until the body becomes less sensitive to the allergen. Make sure you follow up with your caregiver if problems continue. SEEK MEDICAL CARE IF:    You develop fever (more than 100.5 F (38.1 C).   You develop a cough that does not stop easily (persistent).   You have shortness of breath.   You start wheezing.   Symptoms interfere with normal daily activities.  Document Released: 10/18/2000 Document Revised: 01/12/2011 Document Reviewed: 04/29/2008 [facility] Patient Information 2012 [place], [place].   Pacemaker Follow-up   A pacemaker follow-up is done to evaluate the pacemaker and its battery (pulse generator). Regular follow-up visits must be done to assess pacemaker function. These visits are determined by how old the pacemaker is or if there are signs of a low battery. Most pacemakers can also sense if your heart has had any abnormal heart beats (arrhythmias). Different kinds of tests are used to look at pacemaker function, check for abnormal heart beats, and assess battery life. If the battery energy is low, you may need a new battery. BEFORE THE PROCEDURE   Your caregiver may do a physical exam to:  Examine your neck veins for swelling (engorgement).   Feel the skin over the pacemaker for swelling or tenderness.   Check your skin over the pacemaker for signs of redness or wearing away.   Make certain the pacemaker has not moved from its original position.  LET YOUR CAREGIVER KNOW ABOUT:    Chest pain.   Dizziness.   Fainting (syncope).  Skipped heart beats (palpitations).   Fatigue or low energy.   Any car accidents, falls, or injuries that have happened. It is very important for your caregiver to know about any injury that has occurred to your chest or back. Injuries near the pacemaker can affect how it functions.   Any type of "hiccuping" or "jumping" near your upper  stomach (diaphragm). If this happens, the pacemaker settings may need to be changed. This does not mean your pacemaker is malfunctioning.  PROCEDURE There are different ways your pacemaker can be checked. These include:   COMPREHENSIVE EVALUATION To perform a comprehensive evaluation, your caregiver may use a computer called a programmer. The programmer has a special wand that is placed over the pacemaker. The wand communicates with the pacemaker through a radio wave signal. The signal receives information from the pulse generator. By this method, your caregiver can assess pacemaker sensing, function, and battery life. Your caregiver can also make changes to the pacemaker settings. A comprehensive evaluation helps your caregiver fine-tune your pacemaker. PACEMAKER MAGNET ASSESSMENT Your caregiver may perform a magnet test of your pacemaker. This test looks at pacemaker function and battery life. A special magnet is placed over your pacemaker. The magnet causes the pacemaker to function at a fixed rate. The magnet provides some basic testing that can be done at home or in your caregiver's office.   TRANS-TELEPHONIC MONITORING Your caregiver can also monitor your pacemaker at home. This is done over the telephone using a trans-telephonic method. A special device called a trans-telephonic transmitter sends information about your pacemaker to your caregiver's office. The information received allows your caregiver to see if the pacemaker is sensing the heartbeats properly, getting a low battery, or pacing abnormally.   AFTER THE PROCEDURE  You will get a copy of your follow-up visit. The information will have the model number, date, current function and remaining battery life.   Keep your pacemaker card with you at all times. It is important to know:   Implant date.   Type (NBE code).   Brand.   Manufacturer.   Programmed rate.   Generator model.   If the pacemaker has detected an abnormal  heart beat, more testing may be needed.  Document Released: 11/02/2007 Document Revised: 01/12/2011 Document Reviewed: 11/02/2007 [facility] Patient Information 2012 [place], [place].

## 2011-10-23 NOTE — Progress Notes (Signed)
  Subjective:    Patient ID: Melanie Clements, female    DOB: Jun 28, 1917, 78 y.o.   MRN: [id number]  HPI Melanie Clements presents for hospital follow-up after pacemaker placement for trifasicular block. She has done well since hospital d/c - no shortness of breath, no fluid build up, and she has resumed her usual activity. She has some residual soreness at the pacer site. She does complain of nasal drainage - allergic rhinitis by description.  PMH, FamHx and SocHx reviewed for any changes and relevance.  Current Outpatient Prescriptions on File Prior to Visit  Medication Sig Dispense Refill  . calcium carbonate (TUMS LASTING EFFECTS) 500 MG chewable tablet Chew 1 tablet by mouth 2 (two) times daily as needed. For heartburn      . cholestyramine (QUESTRAN) 4 G packet Take 1 packet by mouth 2 (two) times daily with a meal.      . Dorzolamide HCl-Timolol Mal (COSOPT OP) Place 1 drop into both eyes 2 (two) times daily.       . enalapril (VASOTEC) 10 MG tablet TAKE 1 TABLET BY MOUTH EVERY DAY  90 tablet  1  . furosemide (LASIX) 20 MG tablet Take 1 tablet (20 mg total) by mouth daily.  90 tablet  3  . pantoprazole (PROTONIX) 40 MG tablet Take 40 mg by mouth daily.      . prednisoLONE acetate (PRED FORTE) 1 % ophthalmic suspension 1 drop 4 (four) times daily. In right eye      . promethazine-codeine (PHENERGAN WITH CODEINE) 6.25-10 MG/5ML syrup Take 5 mLs by mouth every 6 (six) hours as needed. For cough      . atropine 1 % ophthalmic solution Place 1 drop into the right eye 2 (two) times daily.      Marland Kitchen loratadine (CLARITIN) 10 MG tablet Take 10 mg by mouth daily.        Marland Kitchen loteprednol (LOTEMAX) 0.2 % SUSP 1 drop 4 (four) times daily.        . sodium chloride (MURO 128) 5 % ophthalmic ointment Place 1 drop into the right eye at bedtime.      . travoprost, benzalkonium, (TRAVATAN) 0.004 % ophthalmic solution Place 1 drop into both eyes at bedtime.            Review of Systems System review is negative  for any constitutional, cardiac, pulmonary, GI or neuro symptoms or complaints other than as described in the HPI.     Objective:   Physical Exam Filed Vitals:   10/23/11 1327  BP: 124/60  Pulse: 61  Temp: 97 F (36.1 C)  Resp: 14   Gen'l- WNWD AA womn who looks younger than her age. Cor- pace site looks fine, minimal tenderness noted, dressing is clean and dry. Pulm - normal respirations Ext - trace edema right leg.       Assessment & Plan:

## 2011-10-24 NOTE — Assessment & Plan Note (Signed)
Patient had PPM placed. She has been doing well. No syncope reported. She has better indurance with walking. Pacer site OK.

## 2011-10-26 ENCOUNTER — Encounter: Payer: Self-pay | Admitting: Cardiology

## 2011-10-26 ENCOUNTER — Encounter: Payer: Self-pay | Admitting: Internal Medicine

## 2011-10-26 ENCOUNTER — Ambulatory Visit (INDEPENDENT_AMBULATORY_CARE_PROVIDER_SITE_OTHER): Payer: Medicare Other | Admitting: Cardiology

## 2011-10-26 VITALS — Ht 67.0 in | Wt 119.0 lb

## 2011-10-26 DIAGNOSIS — R001 Bradycardia, unspecified: Secondary | ICD-10-CM

## 2011-10-26 DIAGNOSIS — Z950 Presence of cardiac pacemaker: Secondary | ICD-10-CM

## 2011-10-26 DIAGNOSIS — I498 Other specified cardiac arrhythmias: Secondary | ICD-10-CM

## 2011-10-26 LAB — PACEMAKER DEVICE OBSERVATION
BATTERY VOLTAGE: 2.8 V
RV LEAD AMPLITUDE: 6.3 mv
RV LEAD IMPEDENCE PM: 598 Ohm
RV LEAD THRESHOLD: 0.75 V

## 2011-10-26 NOTE — Progress Notes (Signed)
Wound and device check only. See PaceArt report.

## 2011-12-04 ENCOUNTER — Other Ambulatory Visit (INDEPENDENT_AMBULATORY_CARE_PROVIDER_SITE_OTHER): Payer: 59

## 2011-12-04 ENCOUNTER — Ambulatory Visit (INDEPENDENT_AMBULATORY_CARE_PROVIDER_SITE_OTHER): Payer: 59 | Admitting: Internal Medicine

## 2011-12-04 ENCOUNTER — Encounter: Payer: Self-pay | Admitting: Internal Medicine

## 2011-12-04 VITALS — BP 140/68 | HR 63 | Temp 96.9°F | Resp 16 | Wt 115.0 lb

## 2011-12-04 DIAGNOSIS — I5032 Chronic diastolic (congestive) heart failure: Secondary | ICD-10-CM

## 2011-12-04 DIAGNOSIS — D649 Anemia, unspecified: Secondary | ICD-10-CM

## 2011-12-04 DIAGNOSIS — R5383 Other fatigue: Secondary | ICD-10-CM

## 2011-12-04 DIAGNOSIS — R0609 Other forms of dyspnea: Secondary | ICD-10-CM

## 2011-12-04 DIAGNOSIS — I10 Essential (primary) hypertension: Secondary | ICD-10-CM

## 2011-12-04 DIAGNOSIS — H109 Unspecified conjunctivitis: Secondary | ICD-10-CM

## 2011-12-04 DIAGNOSIS — I498 Other specified cardiac arrhythmias: Secondary | ICD-10-CM

## 2011-12-04 DIAGNOSIS — Z950 Presence of cardiac pacemaker: Secondary | ICD-10-CM

## 2011-12-04 DIAGNOSIS — R001 Bradycardia, unspecified: Secondary | ICD-10-CM

## 2011-12-04 DIAGNOSIS — R5381 Other malaise: Secondary | ICD-10-CM

## 2011-12-04 LAB — COMPREHENSIVE METABOLIC PANEL
Alkaline Phosphatase: 79 U/L (ref 39–117)
Glucose, Bld: 92 mg/dL (ref 70–99)
Sodium: 143 mEq/L (ref 135–145)
Total Bilirubin: 0.3 mg/dL (ref 0.3–1.2)
Total Protein: 6.6 g/dL (ref 6.0–8.3)

## 2011-12-04 LAB — BRAIN NATRIURETIC PEPTIDE: Pro B Natriuretic peptide (BNP): 715 pg/mL — ABNORMAL HIGH (ref 0.0–100.0)

## 2011-12-04 LAB — CBC WITH DIFFERENTIAL/PLATELET
Basophils Absolute: 0 10*3/uL (ref 0.0–0.1)
Eosinophils Relative: 0.6 % (ref 0.0–5.0)
HCT: 40.2 % (ref 36.0–46.0)
Lymphs Abs: 1.4 10*3/uL (ref 0.7–4.0)
MCV: 89.1 fl (ref 78.0–100.0)
Monocytes Absolute: 0.4 10*3/uL (ref 0.1–1.0)
Platelets: 241 10*3/uL (ref 150.0–400.0)
RDW: 14.5 % (ref 11.5–14.6)

## 2011-12-04 NOTE — Patient Instructions (Addendum)
I have no way to make you feel you can run and jump. You need to ask for grace to deal with aging and to appreciate the chance to give others the gift of giving to you.  Will check lab work today to rule out underlying immune disease that may play a role in chronic eye inflammation.  Will check routine lab: kidney function, sodium and potassium.  Lungs are clear on exam today. Your pacemaker site looks just fine.  Thanks for coming to see me.

## 2011-12-04 NOTE — Progress Notes (Signed)
Subjective:    Patient ID: Melanie Clements, female    DOB: [birth year], 78 y.o.   MRN: [id number]  HPI Patient presents for routine follow up of chronic CHF and intermittent Shortness of breath. She has an excellent O2 saturation today. She is also concerned about her weight - reviewed to Aug '12 - she has been very stable.  She is concerned about early satiety but her weight is stable. She has been doing well with PTVDP.   Her chief complaint is that she is too weak to do her usual activities such as cleaning, etc. She doesn't like feeling dependent on others. She does have chronic knee pain.   Her ophthalmologist is concerned about RA or other immune mediated problem causing chronic corneal infection.   Past Medical History  Diagnosis Date  . Acute upper gastrointestinal hemorrhage     2008  . Stiffness of joint, lower leg   . Constipation   . Weakness   . Dyspepsia   . Pulmonary nodule     right upper lobe  . Anemia   . Osteoporosis   . Glaucoma   . Hypertension   . Hyperlipidemia   . CHF (congestive heart failure)     diastolic   Past Surgical History  Procedure Date  . Cataract extraction   . Upper gastrointestinal endoscopy   . Colonoscopy    No family history on file. History   Social History  . Marital Status: Widowed    Spouse Name: N/A    Number of Children: N/A  . Years of Education: N/A   Occupational History  . Not on file.   Social History Main Topics  . Smoking status: Never Smoker   . Smokeless tobacco: Never Used  . Alcohol Use: No  . Drug Use: No  . Sexually Active: No   Other Topics Concern  . Not on file   Social History Narrative   Pt lives alone but has a supportive family and someone is with her most of the time. Both parents and all her siblings are deceased, none with cardiac issues.Marland KitchenMarland KitchenShe has a very supportive family. remains I-ADLs except for driving..  End of Life - Care: discussed with patinet and dtr: DNR. Provided end of life  materials, out of facility order and MOST form.Marland KitchenMarland KitchenDiscussed with pt and family - 10/13/2011 - pt does not want to live on a machine but is OK with being resuscitated.    Current Outpatient Prescriptions on File Prior to Visit  Medication Sig Dispense Refill  . aspirin 81 MG tablet Take 81 mg by mouth daily.      . calcium carbonate (TUMS LASTING EFFECTS) 500 MG chewable tablet Chew 1 tablet by mouth 2 (two) times daily as needed. For heartburn      . cholestyramine (QUESTRAN) 4 G packet Take 1 packet by mouth as needed.       . enalapril (VASOTEC) 10 MG tablet TAKE 1 TABLET BY MOUTH EVERY DAY  90 tablet  1  . furosemide (LASIX) 20 MG tablet Take 1 tablet (20 mg total) by mouth daily.  90 tablet  3  . loratadine (CLARITIN) 10 MG tablet Take 10 mg by mouth daily.        . pantoprazole (PROTONIX) 40 MG tablet Take 40 mg by mouth daily.      . prednisoLONE acetate (PRED FORTE) 1 % ophthalmic suspension Place 1 drop into the right eye 2 (two) times daily. In right eye      .  promethazine-codeine (PHENERGAN WITH CODEINE) 6.25-10 MG/5ML syrup Take 5 mLs by mouth every 6 (six) hours as needed. For cough      . Dorzolamide HCl-Timolol Mal (COSOPT OP) Place 1 drop into both eyes 2 (two) times daily.           Review of Systems System review is negative for any constitutional, cardiac, pulmonary, GI or neuro symptoms or complaints other than as described in the HPI.     Objective:   Physical Exam Filed Vitals:   12/04/11 1317  BP: 140/68  Pulse: 63  Temp: 96.9 F (36.1 C)  Resp: 16   Gen'l- slender older woman in no acute distress HEENT - chronic inflammation of the left bulbar conjunctiva Cor- RRR, pacer pocket w/o inflammation Pulm - good breath sounds, no rales or wheezes Ext - no deformity  Lab Results  Component Value Date   WBC 6.7 12/04/2011   HGB 12.4 12/04/2011   HCT 40.2 12/04/2011   PLT 241.0 12/04/2011   GLUCOSE 92 12/04/2011   CHOL 197 07/27/2009   TRIG 172.0* 07/27/2009     HDL 52.10 07/27/2009   LDLDIRECT 131.3 08/03/2008   LDLCALC 111* 07/27/2009   ALT 21 12/04/2011   AST 25 12/04/2011   NA 143 12/04/2011   K 3.9 12/04/2011   CL 106 12/04/2011   CREATININE 1.6* 12/04/2011   BUN 27* 12/04/2011   CO2 29 12/04/2011   TSH 1.39 10/05/2011   INR 1.12 10/14/2011       pBNP                      715     Assessment & Plan:

## 2011-12-05 LAB — RHEUMATOID FACTOR: Rhuematoid fact SerPl-aCnc: <10 IU/mL (ref <=14)

## 2011-12-05 LAB — ANA: Anti Nuclear Antibody(ANA): NEGATIVE

## 2011-12-05 NOTE — Assessment & Plan Note (Signed)
PTVDP site looks fine. Heart rate on exam was regular. She reports no syncopal or near syncopal spells and feels she has been doing well in this regard.

## 2011-12-05 NOTE — Assessment & Plan Note (Signed)
Exam is unremarkable - lungs are clear. She makes no report of increased SOB/DOE. pBNP mildly elevated at 715  Plan - continue present medications and slightly liberalized fluid restriction.

## 2011-12-05 NOTE — Assessment & Plan Note (Signed)
Hgb 12.4 g this visit - excellent.

## 2011-12-05 NOTE — Assessment & Plan Note (Signed)
Resolved with PTVDP

## 2011-12-05 NOTE — Assessment & Plan Note (Signed)
BP Readings from Last 3 Encounters:  12/04/11 140/68  10/23/11 124/60  10/17/11 118/52   Adequate control on present medications

## 2011-12-05 NOTE — Assessment & Plan Note (Signed)
Chronic stable complaint. She is still able to manage her personal ADLs. Per family they have not noted any meaningful change. They are very attentive and don't let he do much.

## 2011-12-08 ENCOUNTER — Encounter: Payer: Self-pay | Admitting: Internal Medicine

## 2011-12-09 ENCOUNTER — Other Ambulatory Visit: Payer: Self-pay | Admitting: Internal Medicine

## 2011-12-15 ENCOUNTER — Other Ambulatory Visit: Payer: Self-pay | Admitting: Internal Medicine

## 2011-12-27 ENCOUNTER — Telehealth: Payer: Self-pay | Admitting: Internal Medicine

## 2011-12-27 NOTE — Telephone Encounter (Signed)
Caller: Gail/Child; Phone: [phone]; Reason for Call: Daughter called to say that mom was not feeling well last night having nausea.  She has had a few sporadic episodes of this in the past.  Parent is feeling fine today.  She denies any problems and informed daughter that she did not need to call us now.  Offered to call parent to make sure no symptoms that needed to be triaged.  Per patient she feels fine today.  Denies any nausea, abdominal pain, bloating, or any other symptoms to triage at this time.  She will call back if she has symptoms for triage or to make an appointment.

## 2011-12-31 ENCOUNTER — Other Ambulatory Visit: Payer: Self-pay | Admitting: Internal Medicine

## 2012-01-16 ENCOUNTER — Ambulatory Visit (INDEPENDENT_AMBULATORY_CARE_PROVIDER_SITE_OTHER): Payer: 59 | Admitting: Internal Medicine

## 2012-01-16 ENCOUNTER — Encounter: Payer: Self-pay | Admitting: Internal Medicine

## 2012-01-16 VITALS — BP 161/69 | HR 57 | Resp 17 | Ht 65.0 in | Wt 116.0 lb

## 2012-01-16 DIAGNOSIS — I4949 Other premature depolarization: Secondary | ICD-10-CM

## 2012-01-16 DIAGNOSIS — I493 Ventricular premature depolarization: Secondary | ICD-10-CM

## 2012-01-16 DIAGNOSIS — I495 Sick sinus syndrome: Secondary | ICD-10-CM | POA: Insufficient documentation

## 2012-01-16 DIAGNOSIS — I5032 Chronic diastolic (congestive) heart failure: Secondary | ICD-10-CM

## 2012-01-16 LAB — PACEMAKER DEVICE OBSERVATION
BRDY-0002RV: 60 {beats}/min
BRDY-0004RV: 100 {beats}/min
VENTRICULAR PACING PM: 65

## 2012-01-16 NOTE — Assessment & Plan Note (Signed)
Patient has evidence of volume overload. Her heart rates are not all that fast. Her blood pressure is quite elevated. She is little that of volume overload. We will diurese her gently by increasing her Lasix from once a day to twice a day 2 times a week and once a day 5 times a week and   Her blood pressures anomalously high and so we will not make adjustments today and asked the family to keep a close eye on this  Estimated creatinine clearance is about 20. We will check a metabolic profile in 2 weeks

## 2012-01-16 NOTE — Progress Notes (Signed)
Patient Care Team: Jacques Navy, MD as PCP - General   HPI  Melanie Clements is a 78 y.o. female Seen following pacemaker implantation for complete heart block-9/13  she had a prior history of dizziness and weakness but without syncope.  He continues to have some episodes of weakness and dizziness as well as episodic shortness of breath. Treated at Thanksgiving with a 2 days of increased Lasix with significant improvement in edema as well as dyspnea.   Past Medical History  Diagnosis Date  . Acute upper gastrointestinal hemorrhage     2008  . Stiffness of joint, lower leg   . Constipation   . Weakness   . Dyspepsia   . Pulmonary nodule     right upper lobe  . Anemia   . Osteoporosis   . Glaucoma   . Hypertension   . Hyperlipidemia   . CHF (congestive heart failure)     diastolic    Past Surgical History  Procedure Date  . Cataract extraction   . Upper gastrointestinal endoscopy   . Colonoscopy     Current Outpatient Prescriptions  Medication Sig Dispense Refill  . aspirin 81 MG tablet Take 81 mg by mouth daily.      . brinzolamide (AZOPT) 1 % ophthalmic suspension Place 1 drop into both eyes 3 (three) times daily.      . calcium carbonate (TUMS LASTING EFFECTS) 500 MG chewable tablet Chew 1 tablet by mouth 2 (two) times daily as needed. For heartburn      . Dorzolamide HCl-Timolol Mal (COSOPT OP) Place 1 drop into both eyes 2 (two) times daily.       . enalapril (VASOTEC) 10 MG tablet TAKE 1 TABLET BY MOUTH EVERY DAY  90 tablet  1  . furosemide (LASIX) 20 MG tablet Take 1 tablet (20 mg total) by mouth daily.  90 tablet  3  . latanoprost (XALATAN) 0.005 % ophthalmic solution Place 1 drop into both eyes at bedtime.      Marland Kitchen loratadine (CLARITIN) 10 MG tablet Take 10 mg by mouth daily.        . pantoprazole (PROTONIX) 40 MG tablet TAKE 1 TABLET BY MOUTH EVERY MORNING  90 tablet  1  . prednisoLONE acetate (PRED FORTE) 1 % ophthalmic suspension Place 1 drop into the  right eye 2 (two) times daily. In right eye      . promethazine-codeine (PHENERGAN WITH CODEINE) 6.25-10 MG/5ML syrup Take 5 mLs by mouth every 6 (six) hours as needed. For cough      . [DISCONTINUED] pantoprazole (PROTONIX) 40 MG tablet Take 40 mg by mouth daily.      . [DISCONTINUED] pantoprazole (PROTONIX) 40 MG tablet TAKE 1 TABLET BY MOUTH EVERY MORNING  90 tablet  1  . cholestyramine (QUESTRAN) 4 G packet Take 1 packet by mouth as needed.         Allergies  Allergen Reactions  . Risedronate Sodium     REACTION: Diarrhea    Review of Systems negative except from HPI and PMH  Physical Exam BP 161/69  Pulse 57  Resp 17  Ht 5\' 5"  (1.651 m)  Wt 116 lb (52.617 kg)  BMI 19.30 kg/m2  SpO2 99% Well developed and well nourished in no acute distress HENT normal E scleral and icterus clear Neck Supple JVP 7-8  Irregular rate and rhythm Device pocket well healed; without hematoma or erythema abdomen soft bowel sounds No clubbing cyanosis none Edema Alert and oriented, grossly  normal motor and sensory function Skin Warm and Dry  electrocardiogram demonstrates sinus rhythm with complete heart block and frequent PVCs in a pattern of trigeminy  Assessment and  Plan \

## 2012-01-16 NOTE — Patient Instructions (Addendum)
Your physician wants you to follow-up in: September 2014 with Dr. Graciela Husbands.  You will receive a reminder letter in the mail two months in advance. If you don't receive a letter, please call our office to schedule the follow-up appointment.  LASIX INSTRUCTIONS:  Lasix 20mg  twice daily 2 days per week and once daily 5 days per week.  Your physician recommends that you return for lab work in: 2 weeks.  BMET

## 2012-02-05 ENCOUNTER — Telehealth: Payer: Self-pay | Admitting: Internal Medicine

## 2012-02-05 NOTE — Telephone Encounter (Signed)
Patient Information:  Caller Name: [name]  Phone: [phone]  Patient: Melanie Clements, Melanie Clements  Gender: Female  DOB: [birth year]  Age: 78 Years  PCP: Illene Regulus (Adults only)  Office Follow Up:  Does the office need to follow up with this patient?: No  Instructions For The Office: N/A   Symptoms  Reason For Call & Symptoms: Pt has CHF and has been having SOB.  Pacer placerd 10/2011 and is working well.  Pt takes Lasix 20 mg 1 PO QD and now on Tues and Fri she takes this BID per Dr's orders.  Has has intermittent SOB.  She is scheduled to see  Dr. Marikay Alar cardiologist 02/06/12, but just for labs.  She is not with pt,  so called niece Sue Lush at [phone] and left a message to call me back and set PCB for 30  minutes.  I now have niece on the phone and pt is having no respiratory distress now.  Instructed to call Dr. Bernardo Heater office for an appt. To be seen when she goes for lab work tomorrow and if they cannot get in to call back and we can try and schedule an appt. with Dr.  Debby Bud.  Reviewed Health History In EMR: Yes  Reviewed Medications In EMR: Yes  Reviewed Allergies In EMR: Yes  Reviewed Surgeries / Procedures: Yes  Date of Onset of Symptoms: 01/25/2012  Guideline(s) Used:  No Protocol Available - Information Only  Disposition Per Guideline:   Home Care  Reason For Disposition Reached:   Information only question and nurse able to answer  Advice Given:  N/A  RN Overrode Recommendation:  Mark Documentation Complete  Left message for call back.

## 2012-02-06 ENCOUNTER — Other Ambulatory Visit (INDEPENDENT_AMBULATORY_CARE_PROVIDER_SITE_OTHER): Payer: 59

## 2012-02-06 DIAGNOSIS — I5032 Chronic diastolic (congestive) heart failure: Secondary | ICD-10-CM

## 2012-02-06 LAB — BASIC METABOLIC PANEL
BUN: 32 mg/dL — ABNORMAL HIGH (ref 6–23)
CO2: 25 mEq/L (ref 19–32)
Chloride: 107 mEq/L (ref 96–112)
Creatinine, Ser: 1.8 mg/dL — ABNORMAL HIGH (ref 0.4–1.2)
Glucose, Bld: 136 mg/dL — ABNORMAL HIGH (ref 70–99)

## 2012-02-08 ENCOUNTER — Ambulatory Visit (INDEPENDENT_AMBULATORY_CARE_PROVIDER_SITE_OTHER): Payer: 59 | Admitting: Internal Medicine

## 2012-02-08 ENCOUNTER — Ambulatory Visit (INDEPENDENT_AMBULATORY_CARE_PROVIDER_SITE_OTHER)
Admission: RE | Admit: 2012-02-08 | Discharge: 2012-02-08 | Disposition: A | Discharge status: Home / Self Care | Payer: 59 | Source: Ambulatory Visit | Attending: Internal Medicine | Admitting: Internal Medicine

## 2012-02-08 ENCOUNTER — Encounter: Payer: Self-pay | Admitting: Internal Medicine

## 2012-02-08 VITALS — BP 130/72 | HR 55 | Temp 96.9°F | Resp 10 | Wt 110.1 lb

## 2012-02-08 DIAGNOSIS — I495 Sick sinus syndrome: Secondary | ICD-10-CM

## 2012-02-08 DIAGNOSIS — R1013 Epigastric pain: Secondary | ICD-10-CM

## 2012-02-08 DIAGNOSIS — K3189 Other diseases of stomach and duodenum: Secondary | ICD-10-CM

## 2012-02-08 DIAGNOSIS — I5032 Chronic diastolic (congestive) heart failure: Secondary | ICD-10-CM

## 2012-02-08 DIAGNOSIS — I10 Essential (primary) hypertension: Secondary | ICD-10-CM

## 2012-02-08 NOTE — Progress Notes (Signed)
Subjective:    Patient ID: Melanie Clements, female    DOB: [birth year], 79 y.o.   MRN: [id number]  HPI Melanie Clements presents for multiple problems: 1. She is having early satiety, increased gas and eructation. She is taking Protonix daily. She has not had full obstruction and no regurgitation. She has lost 6 lbs 2. She has had increased SOB - she did call EMS "Sunday - was normal on exam with good O2 sat and no signs of fluid overload. 3. She has seen Dr. Klein for follow up and Pacemaker was OK per daughter's report. Note reviewed - he was concerned about fliud and her regimen was changed to lasix 80 mg two days a week and 40 mg 5 days a week.   Past Medical History  Diagnosis Date  . Acute upper gastrointestinal hemorrhage     20" 08  . Stiffness of joint, lower leg   . Constipation   . Weakness   . Dyspepsia   . Pulmonary nodule     right upper lobe  . Anemia   . Osteoporosis   . Glaucoma   . Hypertension   . Hyperlipidemia   . CHF (congestive heart failure)     diastolic  . Sinoatrial node dysfunction    Past Surgical History  Procedure Date  . Cataract extraction   . Upper gastrointestinal endoscopy   . Colonoscopy    History reviewed. No pertinent family history. History   Social History  . Marital Status: Widowed    Spouse Name: N/A    Number of Children: N/A  . Years of Education: N/A   Occupational History  . Not on file.   Social History Main Topics  . Smoking status: Never Smoker   . Smokeless tobacco: Never Used  . Alcohol Use: No  . Drug Use: No  . Sexually Active: No   Other Topics Concern  . Not on file   Social History Narrative   Pt lives alone but has a supportive family and someone is with her most of the time. Both parents and all her siblings are deceased, none with cardiac issues.Marland KitchenMarland KitchenShe has a very supportive family. remains I-ADLs except for driving..  End of Life - Care: discussed with patinet and dtr: DNR. Provided end of life materials,  out of facility order and MOST form.Marland KitchenMarland KitchenDiscussed with pt and family - 10/13/2011 - pt does not want to live on a machine but is OK with being resuscitated.    Current Outpatient Prescriptions on File Prior to Visit  Medication Sig Dispense Refill  . aspirin 81 MG tablet Take 81 mg by mouth daily.      . brinzolamide (AZOPT) 1 % ophthalmic suspension Place 1 drop into both eyes 3 (three) times daily.      . calcium carbonate (TUMS LASTING EFFECTS) 500 MG chewable tablet Chew 1 tablet by mouth 2 (two) times daily as needed. For heartburn      . cholestyramine (QUESTRAN) 4 G packet Take 1 packet by mouth as needed.       . Dorzolamide HCl-Timolol Mal (COSOPT OP) Place 1 drop into both eyes 2 (two) times daily.       . enalapril (VASOTEC) 10 MG tablet TAKE 1 TABLET BY MOUTH EVERY DAY  90 tablet  1  . furosemide (LASIX) 20 MG tablet Take 1 tablet (20 mg total) by mouth daily.  90 tablet  3  . latanoprost (XALATAN) 0.005 % ophthalmic solution Place 1 drop into both eyes at  bedtime.      Marland Kitchen loratadine (CLARITIN) 10 MG tablet Take 10 mg by mouth daily.        . pantoprazole (PROTONIX) 40 MG tablet TAKE 1 TABLET BY MOUTH EVERY MORNING  90 tablet  1  . prednisoLONE acetate (PRED FORTE) 1 % ophthalmic suspension Place 1 drop into the right eye 2 (two) times daily. In right eye      . promethazine-codeine (PHENERGAN WITH CODEINE) 6.25-10 MG/5ML syrup Take 5 mLs by mouth every 6 (six) hours as needed. For cough          Review of Systems System review is negative for any constitutional, cardiac, pulmonary, GI or neuro symptoms or complaints other than as described in the HPI.     Objective:   Physical Exam Filed Vitals:   02/08/12 1109  BP: 130/72  Pulse: 55  Temp: 96.9 F (36.1 C)  Resp: 10   Wt Readings from Last 3 Encounters:  02/08/12 110 lb 1.9 oz (49.95 kg)  01/16/12 116 lb (52.617 kg)  12/04/11 115 lb (52.164 kg)   Gen'l- very elderly AA woman in no acute distress HEENT- arcus senilis,  full dentures Neck - supple Cor - Frequent PVCs, no JVD. Pacer site ok PUlm - very diminished breath sounds, no wheezing Neuro - alert and oriented, speech clear.   BMET    Component Value Date/Time   NA 141 02/06/2012 1241   K 3.5 02/06/2012 1241   CL 107 02/06/2012 1241   CO2 25 02/06/2012 1241   GLUCOSE 136* 02/06/2012 1241   BUN 32* 02/06/2012 1241   CREATININE 1.8* 02/06/2012 1241   CALCIUM 9.6 02/06/2012 1241   GFRNONAA 29* 10/14/2011 0645   GFRAA 33* 10/14/2011 0645   CXR: CHEST - 2 VIEW  Comparison: 10/17/2011  Findings: Left chest wall pacer device is noted with lead in the  right ventricle. There is moderate cardiac enlargement. Bilateral  pleural effusions are identified left greater than right.  Atelectasis is noted in both lung bases. Lungs are hyperexpanded  and there is coarsened interstitial change suggestive of COPD.  Bilateral nipple shadow identified.  IMPRESSION:  1. Moderate to left pleural effusion and small right effusion.  2. Cardiac enlargement.         Assessment & Plan:

## 2012-02-08 NOTE — Patient Instructions (Addendum)
Stomach problems - good bowel sounds, no tenderness. Report suggests possible dysphagia, raising the question of possible stricture related to reflux. Plan Continue pantoprazole every AM  Take Mylanta II 1 teaspoon before main meal(s)  Take ensure or other supplement to maintain weight  For worsening problems with eating and gas will need to consider a consult with GI doctors  Heart failure - Dr. Graciela Husbands thought all was well with the pacemaker. ON exam you have decreased breath sounds. No other signs of fluid build up. Plan Chest x-ray today  Continue your present medications including two lasix tablet 2 days a week, 1 tablet 5 days a week

## 2012-02-10 NOTE — Assessment & Plan Note (Signed)
No reports of syncope or near syncope. Recent follow up with Dr. Graciela Husbands with normal functioning of PTVDP.

## 2012-02-10 NOTE — Assessment & Plan Note (Signed)
BP Readings from Last 3 Encounters:  02/08/12 130/72  01/16/12 161/69  12/04/11 140/68   Good control at today's visit. No change in medications.

## 2012-02-10 NOTE — Assessment & Plan Note (Signed)
On exam no signs of decompensation. X-ray with increased pleural effusions. Last BNP in October 715.  Plan Continue present medical regimen with alternating doses of furosemide  2 D echo for assessment of EF

## 2012-02-10 NOTE — Assessment & Plan Note (Signed)
Reviewed previous lab: variable creatinine but now at 1.8 with est GFR 1.8.  Plan - careful monitoring  - needs to continue diuretic therapy for heart failure and pleural effusions

## 2012-02-10 NOTE — Assessment & Plan Note (Signed)
Symptoms of early satiety, eructation and mild dysphagia suggestive of poorly controlled reflux with possible early stricture.  Plan Continue protonix  Add liquid antacid before meals  For unresolved symptoms will need GI consult and possible EGD

## 2012-02-12 ENCOUNTER — Telehealth: Payer: Self-pay | Admitting: *Deleted

## 2012-02-12 NOTE — Telephone Encounter (Signed)
Called pt's daughter and left a detailed vm with results from chest x-ray. Advised if she has any questions please give [place] a call.

## 2012-02-21 ENCOUNTER — Ambulatory Visit (HOSPITAL_COMMUNITY): Payer: Medicare Other | Attending: Cardiovascular Disease

## 2012-02-21 DIAGNOSIS — I079 Rheumatic tricuspid valve disease, unspecified: Secondary | ICD-10-CM | POA: Insufficient documentation

## 2012-02-21 DIAGNOSIS — R0602 Shortness of breath: Secondary | ICD-10-CM

## 2012-02-21 DIAGNOSIS — I129 Hypertensive chronic kidney disease with stage 1 through stage 4 chronic kidney disease, or unspecified chronic kidney disease: Secondary | ICD-10-CM | POA: Insufficient documentation

## 2012-02-21 DIAGNOSIS — N189 Chronic kidney disease, unspecified: Secondary | ICD-10-CM | POA: Insufficient documentation

## 2012-02-21 DIAGNOSIS — I379 Nonrheumatic pulmonary valve disorder, unspecified: Secondary | ICD-10-CM | POA: Insufficient documentation

## 2012-02-21 DIAGNOSIS — E785 Hyperlipidemia, unspecified: Secondary | ICD-10-CM | POA: Insufficient documentation

## 2012-02-21 DIAGNOSIS — I059 Rheumatic mitral valve disease, unspecified: Secondary | ICD-10-CM | POA: Insufficient documentation

## 2012-02-21 DIAGNOSIS — J90 Pleural effusion, not elsewhere classified: Secondary | ICD-10-CM | POA: Insufficient documentation

## 2012-02-21 DIAGNOSIS — R0989 Other specified symptoms and signs involving the circulatory and respiratory systems: Secondary | ICD-10-CM | POA: Insufficient documentation

## 2012-02-21 DIAGNOSIS — R0609 Other forms of dyspnea: Secondary | ICD-10-CM | POA: Insufficient documentation

## 2012-02-21 DIAGNOSIS — Z950 Presence of cardiac pacemaker: Secondary | ICD-10-CM | POA: Insufficient documentation

## 2012-02-21 DIAGNOSIS — I4510 Unspecified right bundle-branch block: Secondary | ICD-10-CM | POA: Insufficient documentation

## 2012-02-21 DIAGNOSIS — I509 Heart failure, unspecified: Secondary | ICD-10-CM | POA: Insufficient documentation

## 2012-02-21 DIAGNOSIS — I5032 Chronic diastolic (congestive) heart failure: Secondary | ICD-10-CM

## 2012-02-21 NOTE — Progress Notes (Signed)
Echocardiogram performed.  

## 2012-03-01 ENCOUNTER — Encounter: Payer: Self-pay | Admitting: Internal Medicine

## 2012-03-04 ENCOUNTER — Ambulatory Visit (INDEPENDENT_AMBULATORY_CARE_PROVIDER_SITE_OTHER): Payer: 59 | Admitting: Internal Medicine

## 2012-03-04 ENCOUNTER — Encounter: Payer: Self-pay | Admitting: Internal Medicine

## 2012-03-04 VITALS — BP 112/62 | HR 48 | Temp 97.5°F | Resp 10 | Wt 113.0 lb

## 2012-03-04 DIAGNOSIS — I10 Essential (primary) hypertension: Secondary | ICD-10-CM

## 2012-03-04 DIAGNOSIS — I119 Hypertensive heart disease without heart failure: Secondary | ICD-10-CM

## 2012-03-04 DIAGNOSIS — Z7189 Other specified counseling: Secondary | ICD-10-CM

## 2012-03-04 DIAGNOSIS — I428 Other cardiomyopathies: Secondary | ICD-10-CM

## 2012-03-04 DIAGNOSIS — I5032 Chronic diastolic (congestive) heart failure: Secondary | ICD-10-CM

## 2012-03-04 MED ORDER — CARVEDILOL 3.125 MG PO TABS: 3.1250 mg | ORAL_TABLET | Freq: Two times a day (BID) | ORAL | Status: DC

## 2012-03-04 NOTE — Progress Notes (Signed)
Subjective:    Patient ID: Melanie Clements, female    DOB: 1917-03-25, 79 y.o.   MRN: [id number]  HPI Melanie Clements presents for follow-up. Her daughters reports that she has been more tearful. She is having concerns about mortality. She does feel weaker and gets short of breath more easily.  Reviewed 2 D echo from Jan 15, '14 with EF 25-35%, LVH, hypokinesis.   Long discussion with patient and both daughters about ACP - completed MOST and DNR  Past Medical History  Diagnosis Date  . Acute upper gastrointestinal hemorrhage     2008  . Stiffness of joint, lower leg   . Constipation   . Weakness   . Dyspepsia   . Pulmonary nodule     right upper lobe  . Anemia   . Osteoporosis   . Glaucoma   . Hypertension   . Hyperlipidemia   . CHF (congestive heart failure)     diastolic  . Sinoatrial node dysfunction    Past Surgical History  Procedure Date  . Cataract extraction   . Upper gastrointestinal endoscopy   . Colonoscopy    No family history on file. History   Social History  . Marital Status: Widowed    Spouse Name: N/A    Number of Children: N/A  . Years of Education: N/A   Occupational History  . Not on file.   Social History Main Topics  . Smoking status: Never Smoker   . Smokeless tobacco: Never Used  . Alcohol Use: No  . Drug Use: No  . Sexually Active: No   Other Topics Concern  . Not on file   Social History Narrative   Pt lives alone but has a supportive family and someone is with her most of the time. Both parents and all her siblings are deceased, none with cardiac issues.Marland KitchenMarland KitchenShe has a very supportive family. remains I-ADLs except for driving..  ACP/End of Life - Care: discussed with patinet and dtr: DNR. Completed MOST - DNR, Limited Additional interventions, Antibiotic with thoughtfullness, time trial of IVF and enteral nutrition.    Current Outpatient Prescriptions on File Prior to Visit  Medication Sig Dispense Refill  . aspirin 81 MG tablet Take  81 mg by mouth daily.      . brinzolamide (AZOPT) 1 % ophthalmic suspension Place 1 drop into both eyes 3 (three) times daily.      . calcium carbonate (TUMS LASTING EFFECTS) 500 MG chewable tablet Chew 1 tablet by mouth 2 (two) times daily as needed. For heartburn      . enalapril (VASOTEC) 10 MG tablet TAKE 1 TABLET BY MOUTH EVERY DAY  90 tablet  1  . furosemide (LASIX) 20 MG tablet Take 1 tablet (20 mg total) by mouth daily.  90 tablet  3  . latanoprost (XALATAN) 0.005 % ophthalmic solution Place 1 drop into both eyes at bedtime.      Marland Kitchen loratadine (CLARITIN) 10 MG tablet Take 10 mg by mouth daily.        . pantoprazole (PROTONIX) 40 MG tablet TAKE 1 TABLET BY MOUTH EVERY MORNING  90 tablet  1  . prednisoLONE acetate (PRED FORTE) 1 % ophthalmic suspension Place 1 drop into the right eye 2 (two) times daily. In right eye      . cholestyramine (QUESTRAN) 4 G packet Take 1 packet by mouth as needed.       . Dorzolamide HCl-Timolol Mal (COSOPT OP) Place 1 drop into both eyes 2 (two)  times daily.       . promethazine-codeine (PHENERGAN WITH CODEINE) 6.25-10 MG/5ML syrup Take 5 mLs by mouth every 6 (six) hours as needed. For cough          Review of Systems System review is negative for any constitutional, cardiac, pulmonary, GI or neuro symptoms or complaints other than as described in the HPI.     Objective:   Physical Exam Filed Vitals:   03/04/12 1408  BP: 112/62  Pulse: 48  Temp: 97.5 F (36.4 C)  Resp: 10   Gen'l- very elderly AA woman in no acute distress HEENT- temporal wasting, sunken cheeks, arcus senilis Cor-2+ radial pulse Pulm - no increased WOB, CTAP w/o rales or wheezes Neuro - A&O x 3, CN II-XII normal.       Assessment & Plan:

## 2012-03-04 NOTE — Patient Instructions (Addendum)
Heart failure - last 2 D echo reveals decreased cardiac function with an ejection fraction (juice per squeeze) of 30-35%,normal is 60-65%. X-ray showed small effusions - fluid around the lung.  Plan 1. Continue your present medications  2. Start Coreg 3.125 mg twice a day to help with heart failure  3. Referral to the heart failure clinic - Dr. Gala Romney, et al - you will be called with an appointment time. This clinic is in [facility]   Advanced Care Planning - see completed "Out of Facility order" and MOST form. Keep these forms all together in a glycine envelop on the back of the front door for easy visibility and access. Take them to the Hospital (be sure and get them back)  Continue all your other medications.

## 2012-03-05 DIAGNOSIS — Z7189 Other specified counseling: Secondary | ICD-10-CM | POA: Insufficient documentation

## 2012-03-05 NOTE — Assessment & Plan Note (Signed)
Melanie Clements has had progressive cardiomyopathy with a decreasing EF and hypokinesis. She does admit to increasing DOE but does well at rest. No chest pain reported  Plan Add Coreg 3.125 mg bid  Refer to Heart failure clinic

## 2012-03-05 NOTE — Assessment & Plan Note (Signed)
BP Readings from Last 3 Encounters:  03/04/12 112/62  02/08/12 130/72  01/16/12 161/69   Good control at this time.

## 2012-03-05 NOTE — Assessment & Plan Note (Signed)
Met with patient and two daughters Jan 27, '14: executed "Out of Facility Order," and completed MOST. Melanie Clements did understand the discussion and both her daughters did fully understand the discussion and agreed to the MOST choices completed: DNR, DNI,  intermediate intervention but no ICU, antibiotics if needed, short-term trial of IVF/enteral feeding for support in the short term.

## 2012-03-19 ENCOUNTER — Ambulatory Visit (HOSPITAL_COMMUNITY)
Admission: RE | Admit: 2012-03-19 | Discharge: 2012-03-19 | Disposition: A | Discharge status: Home / Self Care | Payer: 59 | Source: Ambulatory Visit | Attending: Internal Medicine | Admitting: Internal Medicine

## 2012-03-19 VITALS — BP 104/58 | HR 60 | Wt 113.0 lb

## 2012-03-19 DIAGNOSIS — I10 Essential (primary) hypertension: Secondary | ICD-10-CM

## 2012-03-19 DIAGNOSIS — I5042 Chronic combined systolic (congestive) and diastolic (congestive) heart failure: Secondary | ICD-10-CM

## 2012-03-19 DIAGNOSIS — I509 Heart failure, unspecified: Secondary | ICD-10-CM

## 2012-03-19 DIAGNOSIS — I5032 Chronic diastolic (congestive) heart failure: Secondary | ICD-10-CM | POA: Insufficient documentation

## 2012-03-19 LAB — BASIC METABOLIC PANEL
BUN: 42 mg/dL — ABNORMAL HIGH (ref 6–23)
CO2: 25 mEq/L (ref 19–32)
Calcium: 9.6 mg/dL (ref 8.4–10.5)
GFR calc non Af Amer: 26 mL/min — ABNORMAL LOW (ref >90)
Glucose, Bld: 86 mg/dL (ref 70–99)

## 2012-03-19 NOTE — Patient Instructions (Addendum)
Increase lasix 40 mg (2 tabs) tomorrow and Thursday.  Labs today  Follow up 2 weeks with Dr. Gala Romney

## 2012-03-19 NOTE — Progress Notes (Signed)
Referring Physician: Dr. Debby Bud Primary Care: Dr. Debby Bud Primary Cardiologist: Dr. Graciela Husbands   HPI:  Melanie Clements is a 79 y.o. AA female with past medical history significant for HTN, HL, CHB s/p St. Jude PPM 10/2011.  She has had diastolic heart failure in the past but last month had c/o dyspnea and repeat echo on 02/21/12 showed LVEF 35-40% with diffuse hypokinesis.  Mildly dilated LA.  RV severe hypertrophy with mildly reduced systolic function.  Mod dilated RA.  Mod TR.  She has been referred by Dr. Debby Bud for further evaluation.    She presents with her daughters, [name] and [name].  She feels ok today.  She feels more shortness of breath in the morning.  Denies orthopnea but occasionally has PND.  No dizziness.  She has also felt decreased appetite and early satiety.  Her granddaughter fixes meals for her.  She has been weighing and weight is up from 110-11 pounds to 113 pounds today.  She is drinking ensures due to poor appetite.   Review of Systems: [y] = yes, [ ]  = no   General: Weight gain [ ] ; Weight loss [place] ]; Anorexia [ ] ; Fatigue [ ] ; Fever [ ] ; Chills [ ] ; Weakness [ ]   Cardiac: Chest pain/pressure [ ] ; Resting SOB [ ] ; Exertional SOB [ y]; Orthopnea [ ] ; Pedal Edema [ ] ; Palpitations [ ] ; Syncope [ ] ; Presyncope [ ] ; Paroxysmal nocturnal dyspnea[y ]  Pulmonary: Cough [ ] ; Wheezing[ ] ; Hemoptysis[ ] ; Sputum [ ] ; Snoring [ ]   GI: Vomiting[ ] ; Dysphagia[ ] ; Melena[ ] ; Hematochezia [ ] ; Heartburn[ ] ; Abdominal pain [ ] ; Constipation [ ] ; Diarrhea [ ] ; BRBPR [ ]   GU: Hematuria[ ] ; Dysuria [ ] ; Nocturia[ ]   Vascular: Pain in legs with walking [ ] ; Pain in feet with lying flat [ ] ; Non-healing sores [ ] ; Stroke [ ] ; TIA [ ] ; Slurred speech [ ] ;  Neuro: Headaches[ ] ; Vertigo[ ] ; Seizures[ ] ; Paresthesias[ ] ;Blurred vision [ ] ; Diplopia [ ] ; Vision changes [ ]   Ortho/Skin: Arthritis [ ] ; Joint pain [ ] ; Muscle pain [ ] ; Joint swelling [ ] ; Back Pain [ ] ; Rash [ ]   Psych: Depression[ ] ; Anxiety[ ]    Heme: Bleeding problems [ ] ; Clotting disorders [ ] ; Anemia [ ]   Endocrine: Diabetes [ ] ; Thyroid dysfunction[ ]    Past Medical History  Diagnosis Date  . Acute upper gastrointestinal hemorrhage     2008  . Stiffness of joint, lower leg   . Constipation   . Weakness   . Dyspepsia   . Pulmonary nodule     right upper lobe  . Anemia   . Osteoporosis   . Glaucoma   . Hypertension   . Hyperlipidemia   . CHF (congestive heart failure)     diastolic  . Sinoatrial node dysfunction     Current Outpatient Prescriptions  Medication Sig Dispense Refill  . aspirin 81 MG tablet Take 81 mg by mouth daily.      . brinzolamide (AZOPT) 1 % ophthalmic suspension Place 1 drop into both eyes 3 (three) times daily.      . calcium carbonate (TUMS LASTING EFFECTS) 500 MG chewable tablet Chew 1 tablet by mouth 2 (two) times daily as needed. For heartburn      . carvedilol (COREG) 3.125 MG tablet Take 1 tablet (3.125 mg total) by mouth 2 (two) times daily with a meal.  60 tablet  3  . cholestyramine (QUESTRAN) 4 G packet Take 1 packet  by mouth as needed.       . enalapril (VASOTEC) 10 MG tablet TAKE 1 TABLET BY MOUTH EVERY DAY  90 tablet  1  . furosemide (LASIX) 20 MG tablet Take 1 tablet (20 mg total) by mouth daily.  90 tablet  3  . latanoprost (XALATAN) 0.005 % ophthalmic solution Place 1 drop into both eyes at bedtime.      Marland Kitchen loratadine (CLARITIN) 10 MG tablet Take 10 mg by mouth daily.        . pantoprazole (PROTONIX) 40 MG tablet TAKE 1 TABLET BY MOUTH EVERY MORNING  90 tablet  1  . prednisoLONE acetate (PRED FORTE) 1 % ophthalmic suspension Place 1 drop into the right eye 2 (two) times daily. In right eye      . promethazine-codeine (PHENERGAN WITH CODEINE) 6.25-10 MG/5ML syrup Take 5 mLs by mouth every 6 (six) hours as needed. For cough       No current facility-administered medications for this encounter.    Allergies  Allergen Reactions  . Risedronate Sodium     REACTION: Diarrhea     History   Social History  . Marital Status: Widowed    Spouse Name: N/A    Number of Children: N/A  . Years of Education: N/A   Occupational History  . Not on file.   Social History Main Topics  . Smoking status: Never Smoker   . Smokeless tobacco: Never Used  . Alcohol Use: No  . Drug Use: No  . Sexually Active: No   Other Topics Concern  . Not on file   Social History Narrative   Pt lives alone but has a supportive family and someone is with her most of the time. Both parents and all her siblings are deceased, none with cardiac issues.Marland KitchenMarland KitchenShe has a very supportive family. remains I-ADLs except for driving..  ACP/End of Life - Care: discussed with patinet and dtr: DNR. Completed MOST - DNR, Limited Additional interventions, Antibiotic with thoughtfullness, time trial of IVF and enteral nutrition.    No family history on file.  PHYSICAL EXAM: Filed Vitals:   03/19/12 1407  BP: 104/58  Pulse: 60  Weight: 113 lb (51.256 kg)  SpO2: 97%    General:  Well appearing. No respiratory difficulty HEENT: normal Neck: supple. no JVD 8-9. Carotids 2+ bilat; no bruits. No lymphadenopathy or thryomegaly appreciated. Cor: PMI nondisplaced. Regular rate & rhythm. TR 3/6 Lungs: clear Abdomen: soft, nontender, nondistended. No hepatosplenomegaly. No bruits or masses. Good bowel sounds. Extremities: no cyanosis, clubbing, rash, 1+edema Neuro: alert & oriented x 3, cranial nerves grossly intact. moves all 4 extremities w/o difficulty. Affect pleasant.    ASSESSMENT & PLAN:

## 2012-03-20 ENCOUNTER — Encounter (HOSPITAL_COMMUNITY): Payer: 59

## 2012-03-22 ENCOUNTER — Encounter (HOSPITAL_COMMUNITY): Payer: Self-pay

## 2012-03-22 DIAGNOSIS — I5042 Chronic combined systolic (congestive) and diastolic (congestive) heart failure: Secondary | ICD-10-CM | POA: Insufficient documentation

## 2012-03-22 NOTE — Assessment & Plan Note (Addendum)
Have discussed the role of the HF clinic and reviewed all records.  Her volume status is elevated today and she has NYHA III symptoms.  Will increase lasix 40 mg for 3 days.  Have discussed use of sliding scale lasix for weight 112 pounds or more.  BP is soft today therefore will not titrate medications further at this time, continue carvedilol and enalapril.  They are interested in attending diet class, will provide with information.  Had a long discussion educating the patient and her daughters about low sodium diet, daily weights and fluid restrictions.  Have provided with HF booklet.  Labs today.

## 2012-03-22 NOTE — Assessment & Plan Note (Signed)
Blood pressure is soft today.  Will continue carvedilol and enalapril.  Will try to titrate at follow up.

## 2012-03-26 ENCOUNTER — Telehealth: Payer: Self-pay | Admitting: Internal Medicine

## 2012-03-26 ENCOUNTER — Telehealth: Payer: Self-pay

## 2012-03-26 MED ORDER — FUROSEMIDE 20 MG PO TABS: ORAL_TABLET | ORAL | Status: DC

## 2012-03-26 NOTE — Telephone Encounter (Signed)
Pt daughter notified   

## 2012-03-26 NOTE — Telephone Encounter (Signed)
Ok to increase Rx to #60 sig 1 po qd except to take two tabs 2 days a week per cardiology

## 2012-03-26 NOTE — Telephone Encounter (Signed)
Patient Information:  Caller Name: [name]  Phone: [phone]  Patient: Melanie Clements, Melanie Clements  Gender: Female  DOB: 09/04/1917  Age: 79 Years  PCP: Illene Regulus (Adults only)  Office Follow Up:  Does the office need to follow up with this patient?: No  Instructions For The Office: N/A   Symptoms  Reason For Call & Symptoms: Gail/ daughter calling; requests medication refill; she has seen cardiologist on 2/11; fluid medication was increased, so pharmacy tells her it is too soon for a refill.  Reviewed EMR and confirmed that this has aleady been handled.  Note to office as information only.  Reviewed Health History In EMR: Yes  Reviewed Medications In EMR: Yes  Reviewed Allergies In EMR: Yes  Reviewed Surgeries / Procedures: Yes  Date of Onset of Symptoms: Unknown  Guideline(s) Used:  No Protocol Available - Information Only  Disposition Per Guideline:   Home Care  Reason For Disposition Reached:   Information only question and nurse able to answer  Advice Given:  Call Back If:  New symptoms develop  You become worse.

## 2012-03-26 NOTE — Telephone Encounter (Signed)
Patient daughter [name] called [facility] stating that pt has ran out of Lasix early due to being advised to add an additional tablet two days out of the week by cardiology. I reviewed Epic, but that is no medication update. Please see the insert from a 03/20/12 encounter. Please advise on what Lasix dose should be Thanks--->    Edited 03/22/2012 11:47 AM by Hadassah Pais, PA     Will increase lasix 40 mg for 3 days.  Have discussed use of sliding scale lasix for weight 112 pounds or more.  BP is soft today therefore will not titrate medications further at this time, continue carvedilol and enalapril.

## 2012-04-02 ENCOUNTER — Ambulatory Visit (HOSPITAL_COMMUNITY)
Admission: RE | Admit: 2012-04-02 | Discharge: 2012-04-02 | Disposition: A | Discharge status: Home / Self Care | Payer: 59 | Source: Ambulatory Visit | Attending: Internal Medicine | Admitting: Internal Medicine

## 2012-04-02 ENCOUNTER — Encounter (HOSPITAL_COMMUNITY): Payer: Self-pay

## 2012-04-02 VITALS — BP 106/62 | HR 58 | Wt 110.8 lb

## 2012-04-02 DIAGNOSIS — I509 Heart failure, unspecified: Secondary | ICD-10-CM | POA: Insufficient documentation

## 2012-04-02 DIAGNOSIS — I5042 Chronic combined systolic (congestive) and diastolic (congestive) heart failure: Secondary | ICD-10-CM

## 2012-04-02 NOTE — Patient Instructions (Addendum)
Take an extra 20 mg lasix if your weight is 111 pounds or greater  Do the following things EVERYDAY: 1) Weigh yourself in the morning before breakfast. Write it down and keep it in a log. 2) Take your medicines as prescribed 3) Eat low salt foods-Limit salt (sodium) to 2000 mg per day.  4) Stay as active as you can everyday 5) Limit all fluids for the day to less than 2 liters

## 2012-04-02 NOTE — Progress Notes (Signed)
Patient ID: Melanie Clements, female   DOB: 12-18-1917, 79 y.o.   MRN: [id number] Referring Physician: Dr. Debby Bud Primary Care: Dr. Debby Bud Primary Cardiologist: Dr. Graciela Husbands   HPI:  Melanie Clements is a 43 y.o. AA female with past medical history significant for HTN, HL, CHB s/p St. Jude PPM 10/2011.  Combined diastolic/systolic  heart failure. ECHO 02/21/12 showed LVEF 35-40% with diffuse hypokinesis.  Mildly dilated LA.  RV severe hypertrophy with mildly reduced systolic function.  Mod dilated RA.  Mod TR.    She presents with her daughters, Melanie Clements and Melanie Clements.  Last visit lasix doubled for 2 days. Denies SOB/PND/Orthopnea/CP. Weight at home 108-113 pounds. Complaint with medications. Ambulates with a walker. Appetite fair. Follows low salt diet. Limits fluid intake to < 2 liters.      Past Medical History  Diagnosis Date  . Acute upper gastrointestinal hemorrhage     2008  . Stiffness of joint, lower leg   . Constipation   . Weakness   . Dyspepsia   . Pulmonary nodule     right upper lobe  . Anemia   . Osteoporosis   . Glaucoma   . Hypertension   . Hyperlipidemia   . Sinoatrial node dysfunction   . CHF (congestive heart failure)     diastolic and systolic (EF 35-40%)    Current Outpatient Prescriptions  Medication Sig Dispense Refill  . aspirin 81 MG tablet Take 81 mg by mouth daily.      . brinzolamide (AZOPT) 1 % ophthalmic suspension Place 1 drop into both eyes 3 (three) times daily.      . carvedilol (COREG) 3.125 MG tablet Take 1 tablet (3.125 mg total) by mouth 2 (two) times daily with a meal.  60 tablet  3  . enalapril (VASOTEC) 10 MG tablet TAKE 1 TABLET BY MOUTH EVERY DAY  90 tablet  1  . furosemide (LASIX) 20 MG tablet 1 po qd except to take two tabs 2 days a week per cardiology  60 tablet  5  . latanoprost (XALATAN) 0.005 % ophthalmic solution Place 1 drop into both eyes at bedtime.      . pantoprazole (PROTONIX) 40 MG tablet TAKE 1 TABLET BY MOUTH EVERY MORNING  90 tablet   1  . calcium carbonate (TUMS LASTING EFFECTS) 500 MG chewable tablet Chew 1 tablet by mouth 2 (two) times daily as needed. For heartburn      . cholestyramine (QUESTRAN) 4 G packet Take 1 packet by mouth as needed.       . loratadine (CLARITIN) 10 MG tablet Take 10 mg by mouth daily.        . prednisoLONE acetate (PRED FORTE) 1 % ophthalmic suspension Place 1 drop into the right eye 2 (two) times daily. In right eye      . promethazine-codeine (PHENERGAN WITH CODEINE) 6.25-10 MG/5ML syrup Take 5 mLs by mouth every 6 (six) hours as needed. For cough       No current facility-administered medications for this encounter.    Allergies  Allergen Reactions  . Risedronate Sodium     REACTION: Diarrhea    History   Social History  . Marital Status: Widowed    Spouse Name: N/A    Number of Children: N/A  . Years of Education: N/A   Occupational History  . Not on file.   Social History Main Topics  . Smoking status: Never Smoker   . Smokeless tobacco: Never Used  . Alcohol Use: No  .  Drug Use: No  . Sexually Active: No   Other Topics Concern  . Not on file   Social History Narrative   Pt lives alone but has a supportive family and someone is with her most of the time. Both parents and all her siblings are deceased, none with cardiac issues.Marland KitchenMarland KitchenShe has a very supportive family. remains I-ADLs except for driving..  ACP/End of Life - Care: discussed with patinet and dtr: DNR. Completed MOST - DNR, Limited Additional interventions, Antibiotic with thoughtfullness, time trial of IVF and enteral nutrition.    No family history on file.  PHYSICAL EXAM: Filed Vitals:   04/02/12 1158  BP: 106/62  Pulse: 58  Weight: 110 lb 12.8 oz (50.259 kg)  SpO2: 97%    General:  Well appearing. No respiratory difficulty 2 daughters present HEENT: normal Neck: supple. no JVD. Carotids 2+ bilat; no bruits. No lymphadenopathy or thryomegaly appreciated. Cor: PMI nondisplaced. Regular rate & rhythm.  TR 3/6 Lungs: clear Abdomen: soft, nontender, nondistended. No hepatosplenomegaly. No bruits or masses. Good bowel sounds. Extremities: no cyanosis, clubbing, rash, edema Neuro: alert & oriented x 3, cranial nerves grossly intact. moves all 4 extremities w/o difficulty. Affect pleasant.    ASSESSMENT & PLAN:

## 2012-04-02 NOTE — Assessment & Plan Note (Signed)
Volume status improved. Continue current diuretic regimen. She is instructed to take an additional 20 mg of lasix if her is 111 pounds or greater. Follow up in 1 month to reassess volume status.

## 2012-05-09 ENCOUNTER — Ambulatory Visit (HOSPITAL_COMMUNITY)
Admission: RE | Admit: 2012-05-09 | Discharge: 2012-05-09 | Disposition: A | Discharge status: Home / Self Care | Payer: 59 | Source: Ambulatory Visit | Attending: Internal Medicine | Admitting: Internal Medicine

## 2012-05-09 VITALS — BP 130/62 | HR 60 | Wt 107.5 lb

## 2012-05-09 DIAGNOSIS — I509 Heart failure, unspecified: Secondary | ICD-10-CM | POA: Insufficient documentation

## 2012-05-09 DIAGNOSIS — I5042 Chronic combined systolic (congestive) and diastolic (congestive) heart failure: Secondary | ICD-10-CM | POA: Insufficient documentation

## 2012-05-09 MED ORDER — FUROSEMIDE 20 MG PO TABS: ORAL_TABLET | ORAL | Status: DC

## 2012-05-09 NOTE — Assessment & Plan Note (Signed)
NYHA III. Volume status stable. Continue lasix 20 mg daily. Instructed to hold lasix if her is 104 pound and take an additional 20 mg if weight is 111 pounds or greater. Follow up in 3 months with Dr Gala Romney.

## 2012-05-09 NOTE — Progress Notes (Signed)
Patient ID: Melanie Clements, female   DOB: 1917/03/08, 79 y.o.   MRN: [id number] Referring Physician: Dr. Debby Bud Primary Care: Dr. Debby Bud Primary Cardiologist: Dr. Graciela Husbands   HPI: Melanie Clements is a 83 y.o. AA female with past medical history significant for HTN, HL, CHB s/p St. Jude PPM 10/2011.  Combined diastolic/systolic  heart failure. ECHO 02/21/12 showed LVEF 35-40% with diffuse hypokinesis.  Mildly dilated LA.  RV severe hypertrophy with mildly reduced systolic function.  Mod dilated RA.  Mod TR.    She returns for follow up. Lasix visit she was instructed to take an additional 20 mg of lasix if her weight was 111 pounds or greater. She is has not required extra lasix. Denies SOB/PND/Orthopnea. Compliant with medications. Poor appetite.      Past Medical History  Diagnosis Date  . Acute upper gastrointestinal hemorrhage     2008  . Stiffness of joint, lower leg   . Constipation   . Weakness   . Dyspepsia   . Pulmonary nodule     right upper lobe  . Anemia   . Osteoporosis   . Glaucoma   . Hypertension   . Hyperlipidemia   . Sinoatrial node dysfunction   . CHF (congestive heart failure)     diastolic and systolic (EF 35-40%)    Current Outpatient Prescriptions  Medication Sig Dispense Refill  . aspirin 81 MG tablet Take 81 mg by mouth daily.      . brinzolamide (AZOPT) 1 % ophthalmic suspension Place 1 drop into both eyes 3 (three) times daily.      . calcium carbonate (TUMS LASTING EFFECTS) 500 MG chewable tablet Chew 1 tablet by mouth 2 (two) times daily as needed. For heartburn      . carvedilol (COREG) 3.125 MG tablet Take 1 tablet (3.125 mg total) by mouth 2 (two) times daily with a meal.  60 tablet  3  . cholestyramine (QUESTRAN) 4 G packet Take 1 packet by mouth as needed.       . enalapril (VASOTEC) 10 MG tablet TAKE 1 TABLET BY MOUTH EVERY DAY  90 tablet  1  . furosemide (LASIX) 20 MG tablet 1 po qd except to take two tabs 2 days a week per cardiology  60 tablet  5   . latanoprost (XALATAN) 0.005 % ophthalmic solution Place 1 drop into both eyes at bedtime.      Marland Kitchen loratadine (CLARITIN) 10 MG tablet Take 10 mg by mouth daily.        . pantoprazole (PROTONIX) 40 MG tablet TAKE 1 TABLET BY MOUTH EVERY MORNING  90 tablet  1  . prednisoLONE acetate (PRED FORTE) 1 % ophthalmic suspension Place 1 drop into the right eye daily. In right eye      . promethazine-codeine (PHENERGAN WITH CODEINE) 6.25-10 MG/5ML syrup Take 5 mLs by mouth every 6 (six) hours as needed. For cough      . sodium chloride (MURO 128) 5 % ophthalmic ointment 1 drop daily.       No current facility-administered medications for this encounter.    Allergies  Allergen Reactions  . Risedronate Sodium     REACTION: Diarrhea    History   Social History  . Marital Status: Widowed    Spouse Name: N/A    Number of Children: N/A  . Years of Education: N/A   Occupational History  . Not on file.   Social History Main Topics  . Smoking status: Never Smoker   .  Smokeless tobacco: Never Used  . Alcohol Use: No  . Drug Use: No  . Sexually Active: No   Other Topics Concern  . Not on file   Social History Narrative   Pt lives alone but has a supportive family and someone is with her most of the time. Both parents and all her siblings are deceased, none with cardiac issues.Marland KitchenMarland KitchenShe has a very supportive family. remains I-ADLs except for driving..  ACP/End of Life - Care: discussed with patinet and dtr: DNR. Completed MOST - DNR, Limited Additional interventions, Antibiotic with thoughtfullness, time trial of IVF and enteral nutrition.    No family history on file.  PHYSICAL EXAM: Filed Vitals:   05/09/12 1130  BP: 130/62  Pulse: 60  Weight: 107 lb 8 oz (48.762 kg)  SpO2: 99%    General:  Well appearing. No respiratory difficulty 2 daughters present HEENT: normal Neck: supple. no JVD. Carotids 2+ bilat; no bruits. No lymphadenopathy or thryomegaly appreciated. Cor: PMI nondisplaced.  Regular rate & rhythm. TR 3/6 Lungs: clear Abdomen: soft, nontender, nondistended. No hepatosplenomegaly. No bruits or masses. Good bowel sounds. Extremities: no cyanosis, clubbing, rash, edema Neuro: alert & oriented x 3, cranial nerves grossly intact. moves all 4 extremities w/o difficulty. Affect pleasant.    ASSESSMENT & PLAN:

## 2012-05-09 NOTE — Patient Instructions (Addendum)
Follow up in 3 months  Take lasix 20 mg daily. Hold lasix if your weight is 104 pounds or less. Take an extra 20 mg of lasix if your weight is 111 pounds.  Do the following things EVERYDAY: 1) Weigh yourself in the morning before breakfast. Write it down and keep it in a log. 2) Take your medicines as prescribed 3) Eat low salt foods-Limit salt (sodium) to 2000 mg per day.  4) Stay as active as you can everyday 5) Limit all fluids for the day to less than 2 liters

## 2012-05-09 NOTE — Progress Notes (Signed)
Patient ID: Melanie Clements, female   DOB: [birth year], 79 y.o.   MRN: [id number] Referring Physician: Dr. Debby Bud Primary Care: Dr. Debby Bud Primary Cardiologist: Dr. Graciela Husbands   HPI:  Melanie Clements is a 79 y.o. AA female with past medical history significant for HTN, HL, CHB s/p St. Jude PPM 10/2011.  Combined diastolic/systolic  heart failure. ECHO 02/21/12 showed LVEF 35-40% with diffuse hypokinesis.  Mildly dilated LA.  RV severe hypertrophy with mildly reduced systolic function.  Mod dilated RA.  Mod TR.    She returns for follow up with her daughters, Melanie Clements and Melanie Clements. Complains of abdominal fullness. Last visit she was instructed to take an additional 20 mg of Lasix for weight 111 pounds or greater. She has not required an extra lasix.  Denies SOB/PND/Orthopnea/CP. Weight at home 105-109 pounds. Complaint with medications. Ambulates with a walker. Appetite fair. Follows low salt diet. Limits fluid intake to < 2 liters.      Past Medical History  Diagnosis Date  . Acute upper gastrointestinal hemorrhage     2008  . Stiffness of joint, lower leg   . Constipation   . Weakness   . Dyspepsia   . Pulmonary nodule     right upper lobe  . Anemia   . Osteoporosis   . Glaucoma   . Hypertension   . Hyperlipidemia   . Sinoatrial node dysfunction   . CHF (congestive heart failure)     diastolic and systolic (EF 35-40%)    Current Outpatient Prescriptions  Medication Sig Dispense Refill  . aspirin 81 MG tablet Take 81 mg by mouth daily.      . brinzolamide (AZOPT) 1 % ophthalmic suspension Place 1 drop into both eyes 3 (three) times daily.      . calcium carbonate (TUMS LASTING EFFECTS) 500 MG chewable tablet Chew 1 tablet by mouth 2 (two) times daily as needed. For heartburn      . carvedilol (COREG) 3.125 MG tablet Take 1 tablet (3.125 mg total) by mouth 2 (two) times daily with a meal.  60 tablet  3  . cholestyramine (QUESTRAN) 4 G packet Take 1 packet by mouth as needed.       . enalapril  (VASOTEC) 10 MG tablet TAKE 1 TABLET BY MOUTH EVERY DAY  90 tablet  1  . furosemide (LASIX) 20 MG tablet 1 po qd except to take two tabs 2 days a week per cardiology  60 tablet  5  . latanoprost (XALATAN) 0.005 % ophthalmic solution Place 1 drop into both eyes at bedtime.      Marland Kitchen loratadine (CLARITIN) 10 MG tablet Take 10 mg by mouth daily.        . pantoprazole (PROTONIX) 40 MG tablet TAKE 1 TABLET BY MOUTH EVERY MORNING  90 tablet  1  . prednisoLONE acetate (PRED FORTE) 1 % ophthalmic suspension Place 1 drop into the right eye daily. In right eye      . promethazine-codeine (PHENERGAN WITH CODEINE) 6.25-10 MG/5ML syrup Take 5 mLs by mouth every 6 (six) hours as needed. For cough      . sodium chloride (MURO 128) 5 % ophthalmic ointment 1 drop daily.       No current facility-administered medications for this encounter.    Allergies  Allergen Reactions  . Risedronate Sodium     REACTION: Diarrhea    History   Social History  . Marital Status: Widowed    Spouse Name: N/A    Number of Children:  N/A  . Years of Education: N/A   Occupational History  . Not on file.   Social History Main Topics  . Smoking status: Never Smoker   . Smokeless tobacco: Never Used  . Alcohol Use: No  . Drug Use: No  . Sexually Active: No   Other Topics Concern  . Not on file   Social History Narrative   Pt lives alone but has a supportive family and someone is with her most of the time. Both parents and all her siblings are deceased, none with cardiac issues.Marland KitchenMarland KitchenShe has a very supportive family. remains I-ADLs except for driving..  ACP/End of Life - Care: discussed with patinet and dtr: DNR. Completed MOST - DNR, Limited Additional interventions, Antibiotic with thoughtfullness, time trial of IVF and enteral nutrition.    No family history on file.  PHYSICAL EXAM: Filed Vitals:   05/09/12 1130  BP: 130/62  Pulse: 60  Weight: 107 lb 8 oz (48.762 kg)  SpO2: 99%    General:  Well appearing. No  respiratory difficulty 2 daughters present HEENT: normal Neck: supple. no JVD. Carotids 2+ bilat; no bruits. No lymphadenopathy or thryomegaly appreciated. Cor: PMI nondisplaced. Regular rate & rhythm. TR 3/6 Lungs: clear Abdomen: soft, nontender, nondistended. No hepatosplenomegaly. No bruits or masses. Good bowel sounds. Extremities: no cyanosis, clubbing, rash, edema Neuro: alert & oriented x 3, cranial nerves grossly intact. moves all 4 extremities w/o difficulty. Affect pleasant.    ASSESSMENT & PLAN:

## 2012-05-21 ENCOUNTER — Ambulatory Visit (INDEPENDENT_AMBULATORY_CARE_PROVIDER_SITE_OTHER)
Admission: RE | Admit: 2012-05-21 | Discharge: 2012-05-21 | Disposition: A | Discharge status: Home / Self Care | Payer: 59 | Source: Ambulatory Visit | Attending: Internal Medicine | Admitting: Internal Medicine

## 2012-05-21 ENCOUNTER — Ambulatory Visit (INDEPENDENT_AMBULATORY_CARE_PROVIDER_SITE_OTHER): Payer: 59 | Admitting: Internal Medicine

## 2012-05-21 ENCOUNTER — Other Ambulatory Visit (INDEPENDENT_AMBULATORY_CARE_PROVIDER_SITE_OTHER): Payer: 59

## 2012-05-21 ENCOUNTER — Ambulatory Visit: Payer: 59 | Admitting: Internal Medicine

## 2012-05-21 ENCOUNTER — Encounter: Payer: Self-pay | Admitting: Internal Medicine

## 2012-05-21 VITALS — BP 120/68 | HR 72 | Temp 99.0°F | Resp 16 | Wt 108.0 lb

## 2012-05-21 DIAGNOSIS — K5900 Constipation, unspecified: Secondary | ICD-10-CM

## 2012-05-21 DIAGNOSIS — I5032 Chronic diastolic (congestive) heart failure: Secondary | ICD-10-CM

## 2012-05-21 DIAGNOSIS — R109 Unspecified abdominal pain: Secondary | ICD-10-CM

## 2012-05-21 DIAGNOSIS — R1310 Dysphagia, unspecified: Secondary | ICD-10-CM

## 2012-05-21 DIAGNOSIS — R509 Fever, unspecified: Secondary | ICD-10-CM

## 2012-05-21 DIAGNOSIS — J90 Pleural effusion, not elsewhere classified: Secondary | ICD-10-CM

## 2012-05-21 LAB — BASIC METABOLIC PANEL
BUN: 32 mg/dL — ABNORMAL HIGH (ref 6–23)
Chloride: 105 mEq/L (ref 96–112)
Creatinine, Ser: 1.6 mg/dL — ABNORMAL HIGH (ref 0.4–1.2)
GFR: 37.88 mL/min — ABNORMAL LOW (ref >60.00)
Potassium: 4.4 mEq/L (ref 3.5–5.1)

## 2012-05-21 LAB — URINALYSIS
Bilirubin Urine: NEGATIVE
Hgb urine dipstick: NEGATIVE
Leukocytes, UA: NEGATIVE
Nitrite: NEGATIVE
Total Protein, Urine: NEGATIVE

## 2012-05-21 LAB — CBC WITH DIFFERENTIAL/PLATELET
Basophils Absolute: 0 10*3/uL (ref 0.0–0.1)
Eosinophils Absolute: 0 10*3/uL (ref 0.0–0.7)
Lymphocytes Relative: 16.7 % (ref 12.0–46.0)
MCHC: 31.8 g/dL (ref 30.0–36.0)
MCV: 87.6 fl (ref 78.0–100.0)
Monocytes Absolute: 0.7 10*3/uL (ref 0.1–1.0)
Neutro Abs: 6.5 10*3/uL (ref 1.4–7.7)
Neutrophils Relative %: 74.9 % (ref 43.0–77.0)
RDW: 15.9 % — ABNORMAL HIGH (ref 11.5–14.6)

## 2012-05-21 MED ORDER — MINERAL OIL PO OIL: 15.0000 mL | TOPICAL_OIL | Freq: Every day | ORAL | Status: AC | PRN

## 2012-05-21 MED ORDER — CIPROFLOXACIN HCL 250 MG PO TABS: 250.0000 mg | ORAL_TABLET | Freq: Two times a day (BID) | ORAL | Status: DC

## 2012-05-21 NOTE — Progress Notes (Signed)
Subjective:    Patient ID: Melanie Clements, female    DOB: 08-19-1917, 79 y.o.   MRN: [id number]  Abdominal Pain This is a chronic problem. The current episode started more than 1 month ago. The onset quality is undetermined. The problem occurs intermittently. The problem has been waxing and waning. The pain is located in the LLQ and RLQ. The pain is moderate. The quality of the pain is dull. The abdominal pain does not radiate. Associated symptoms include constipation and weight loss. Pertinent negatives include no frequency, headaches, nausea or vomiting. She has tried nothing for the symptoms. The treatment provided no relief. Her past medical history is significant for GERD.  C/o trouble swallowing at times - not new  Wt Readings from Last 3 Encounters:  05/21/12 108 lb (48.988 kg)  05/09/12 107 lb 8 oz (48.762 kg)  04/02/12 110 lb 12.8 oz (50.259 kg)   BP Readings from Last 3 Encounters:  05/21/12 120/68  05/09/12 130/62  04/02/12 106/62       Review of Systems  Constitutional: Positive for weight loss. Negative for chills, activity change, appetite change, fatigue and unexpected weight change.  HENT: Negative for congestion, mouth sores and sinus pressure.   Eyes: Negative for visual disturbance.  Respiratory: Negative for cough and chest tightness.   Cardiovascular: Negative for chest pain.  Gastrointestinal: Positive for abdominal pain and constipation. Negative for nausea, vomiting, abdominal distention, anal bleeding and rectal pain.  Endocrine: Negative for cold intolerance.  Genitourinary: Positive for urgency. Negative for frequency, difficulty urinating, vaginal pain and pelvic pain.  Musculoskeletal: Negative for back pain and gait problem.  Skin: Negative for pallor and rash.  Neurological: Negative for dizziness, tremors, weakness, numbness and headaches.  Psychiatric/Behavioral: Negative for confusion and sleep disturbance.       Objective:   Physical Exam   Constitutional: She appears well-developed. No distress.  HENT:  Head: Normocephalic.  Right Ear: External ear normal.  Left Ear: External ear normal.  Nose: Nose normal.  Mouth/Throat: Oropharynx is clear and moist.  Eyes: Conjunctivae are normal. Pupils are equal, round, and reactive to light. Right eye exhibits no discharge. Left eye exhibits no discharge.  Neck: Normal range of motion. Neck supple. No JVD present. No tracheal deviation present. No thyromegaly present.  Cardiovascular: Normal rate, regular rhythm and normal heart sounds.   Pulmonary/Chest: No stridor. No respiratory distress. She has no wheezes.  Abdominal: Soft. Bowel sounds are normal. She exhibits no distension and no mass. There is no tenderness. There is no rebound and no guarding.  Musculoskeletal: She exhibits no edema and no tenderness.  Lymphadenopathy:    She has no cervical adenopathy.  Neurological: She displays normal reflexes. No cranial nerve deficit. She exhibits normal muscle tone. Coordination normal.  Skin: No rash noted. No erythema.  Psychiatric: She has a normal mood and affect. Her behavior is normal. Judgment and thought content normal.   Lab Results  Component Value Date   WBC 8.7 05/21/2012   HGB 13.0 05/21/2012   HCT 41.0 05/21/2012   PLT 193.0 05/21/2012   GLUCOSE 115* 05/21/2012   CHOL 197 07/27/2009   TRIG 172.0* 07/27/2009   HDL 52.10 07/27/2009   LDLDIRECT 131.3 08/03/2008   LDLCALC 111* 07/27/2009   ALT 21 12/04/2011   AST 25 12/04/2011   NA 138 05/21/2012   K 4.4 05/21/2012   CL 105 05/21/2012   CREATININE 1.6* 05/21/2012   BUN 32* 05/21/2012   CO2 25 05/21/2012  TSH 1.39 10/05/2011   INR 1.12 10/14/2011   CXR B effusions  A complex case. Chart reviewed        Assessment & Plan:

## 2012-05-21 NOTE — Assessment & Plan Note (Signed)
On Protonix GI cons if worse

## 2012-05-21 NOTE — Assessment & Plan Note (Signed)
Worse. Start Mineral oil daily

## 2012-05-21 NOTE — Assessment & Plan Note (Signed)
Continue with current prescription therapy as reflected on the Med list. Furosemide 40 mg/d

## 2012-05-21 NOTE — Assessment & Plan Note (Addendum)
4/14: Chronic B L>R - likely CHF related Use furosemide 40 mg/d

## 2012-05-21 NOTE — Assessment & Plan Note (Signed)
Labs, xray Empiric Cipro

## 2012-05-21 NOTE — Assessment & Plan Note (Signed)
mild: UTI vs diverticulitis vs other Empiric abx

## 2012-06-11 ENCOUNTER — Other Ambulatory Visit: Payer: Self-pay | Admitting: Internal Medicine

## 2012-07-02 ENCOUNTER — Other Ambulatory Visit: Payer: Self-pay | Admitting: Internal Medicine

## 2012-07-03 ENCOUNTER — Other Ambulatory Visit: Payer: Self-pay | Admitting: Internal Medicine

## 2012-08-02 ENCOUNTER — Encounter: Payer: Self-pay | Admitting: Internal Medicine

## 2012-08-02 ENCOUNTER — Ambulatory Visit (INDEPENDENT_AMBULATORY_CARE_PROVIDER_SITE_OTHER): Payer: 59 | Admitting: Internal Medicine

## 2012-08-02 VITALS — BP 108/62 | HR 63 | Temp 97.6°F | Resp 16 | Wt 105.1 lb

## 2012-08-02 DIAGNOSIS — J309 Allergic rhinitis, unspecified: Secondary | ICD-10-CM

## 2012-08-02 DIAGNOSIS — R05 Cough: Secondary | ICD-10-CM

## 2012-08-02 MED ORDER — PROMETHAZINE-CODEINE 6.25-10 MG/5ML PO SYRP: 5.0000 mL | ORAL_SOLUTION | ORAL | Status: DC | PRN

## 2012-08-02 NOTE — Progress Notes (Signed)
HPI  Pt presents to the clinic today with c/o cough and runny nose. This started 3 days ago. She is taking generic zyrtec OTC but it is making her very drowsy. She only coughs during the night. It is not productive. It does seem to keep her awake. She denies fever, chills, chest tightness or shortness of breath.  She has not had sick contacts that she is aware of.  Review of Systems      Past Medical History  Diagnosis Date  . Acute upper gastrointestinal hemorrhage     2008  . Stiffness of joint, lower leg   . Constipation   . Weakness   . Dyspepsia   . Pulmonary nodule     right upper lobe  . Anemia   . Osteoporosis   . Glaucoma   . Hypertension   . Hyperlipidemia   . Sinoatrial node dysfunction   . CHF (congestive heart failure)     diastolic and systolic (EF 35-40%)    History reviewed. No pertinent family history.  History   Social History  . Marital Status: Widowed    Spouse Name: N/A    Number of Children: N/A  . Years of Education: N/A   Occupational History  . Not on file.   Social History Main Topics  . Smoking status: Never Smoker   . Smokeless tobacco: Never Used  . Alcohol Use: No  . Drug Use: No  . Sexually Active: No   Other Topics Concern  . Not on file   Social History Narrative   Pt lives alone but has a supportive family and someone is with her most of the time. Both parents and all her siblings are deceased, none with cardiac issues.Marland KitchenMarland KitchenShe has a very supportive family. remains I-ADLs except for driving..  ACP/End of Life - Care: discussed with patinet and dtr: DNR. Completed MOST - DNR, Limited Additional interventions, Antibiotic with thoughtfullness, time trial of IVF and enteral nutrition.    Allergies  Allergen Reactions  . Risedronate Sodium     REACTION: Diarrhea     Constitutional: Denies headache, fatigue, fever or abrupt weight changes.  HEENT:  Positive runny nose. Denies eye redness, eye pain, pressure behind the eyes,  facial pain, nasal congestion, ear pain, ringing in the ears, wax buildup,  or bloody nose. Respiratory: Positive cough. Denies difficulty breathing or shortness of breath.  Cardiovascular: Denies chest pain, chest tightness, palpitations or swelling in the hands or feet.   No other specific complaints in a complete review of systems (except as listed in HPI above).  Objective:   BP 108/62  Pulse 63  Temp(Src) 97.6 F (36.4 C) (Oral)  Resp 16  Wt 105 lb 1.3 oz (47.664 kg)  BMI 17.49 kg/m2  SpO2 97% Wt Readings from Last 3 Encounters:  08/02/12 105 lb 1.3 oz (47.664 kg)  05/21/12 108 lb (48.988 kg)  05/09/12 107 lb 8 oz (48.762 kg)     General: Appears her stated age, well developed, well nourished in NAD. HEENT: Head: normal shape and size; Eyes: sclera white, no icterus, conjunctiva pink, PERRLA and EOMs intact; Ears: Tm's gray and intact, normal light reflex; Nose: mucosa pink and moist, septum midline; Throat/Mouth: + PND. Teeth present, mucosa erythematous and moist, no exudate noted, no lesions or ulcerations noted.  Neck: Neck supple, trachea midline. No massses, lumps or thyromegaly present.  Cardiovascular: Normal rate and rhythm. S1,S2 noted.  No murmur, rubs or gallops noted. No JVD or BLE edema. No  carotid bruits noted. Pulmonary/Chest: Normal effort and positive vesicular breath sounds. No respiratory distress. No wheezes, rales or ronchi noted.      Assessment & Plan:   Cough secondary ot allergic rhinitis:  Get some rest and drink plenty of water eRx for cough syrup at bedtime Continue zyrtec but try the non drowsy kind  RTC as needed or if symptoms persist.

## 2012-08-02 NOTE — Patient Instructions (Signed)
Allergic Rhinitis Allergic rhinitis is when the mucous membranes in the nose respond to allergens. Allergens are particles in the air that cause your body to have an allergic reaction. This causes you to release allergic antibodies. Through a chain of events, these eventually cause you to release histamine into the blood stream (hence the use of antihistamines). Although meant to be protective to the body, it is this release that causes your discomfort, such as frequent sneezing, congestion and an itchy runny nose.  CAUSES  The pollen allergens may come from grasses, trees, and weeds. This is seasonal allergic rhinitis, or "hay fever." Other allergens cause year-round allergic rhinitis (perennial allergic rhinitis) such as house dust mite allergen, pet dander and mold spores.  SYMPTOMS   Nasal stuffiness (congestion).  Runny, itchy nose with sneezing and tearing of the eyes.  There is often an itching of the mouth, eyes and ears. It cannot be cured, but it can be controlled with medications. DIAGNOSIS  If you are unable to determine the offending allergen, skin or blood testing may find it. TREATMENT   Avoid the allergen.  Medications and allergy shots (immunotherapy) can help.  Hay fever may often be treated with antihistamines in pill or nasal spray forms. Antihistamines block the effects of histamine. There are over-the-counter medicines that may help with nasal congestion and swelling around the eyes. Check with your caregiver before taking or giving this medicine. If the treatment above does not work, there are many new medications your caregiver can prescribe. Stronger medications may be used if initial measures are ineffective. Desensitizing injections can be used if medications and avoidance fails. Desensitization is when a patient is given ongoing shots until the body becomes less sensitive to the allergen. Make sure you follow up with your caregiver if problems continue. SEEK MEDICAL  CARE IF:   You develop fever (more than 100.5 F (38.1 C).  You develop a cough that does not stop easily (persistent).  You have shortness of breath.  You start wheezing.  Symptoms interfere with normal daily activities. Document Released: 10/18/2000 Document Revised: 04/17/2011 Document Reviewed: 04/29/2008 Exit[employer] Patient Information 2014 [employer].  

## 2012-08-07 ENCOUNTER — Encounter: Payer: Self-pay | Admitting: Internal Medicine

## 2012-08-13 ENCOUNTER — Ambulatory Visit (HOSPITAL_COMMUNITY)
Admission: RE | Admit: 2012-08-13 | Discharge: 2012-08-13 | Disposition: A | Discharge status: Home / Self Care | Payer: Medicare Other | Source: Ambulatory Visit | Attending: Internal Medicine | Admitting: Internal Medicine

## 2012-08-13 ENCOUNTER — Encounter (HOSPITAL_COMMUNITY): Payer: Self-pay

## 2012-08-13 VITALS — BP 100/70 | HR 64 | Wt 105.0 lb

## 2012-08-13 DIAGNOSIS — I509 Heart failure, unspecified: Secondary | ICD-10-CM | POA: Insufficient documentation

## 2012-08-13 DIAGNOSIS — I5042 Chronic combined systolic (congestive) and diastolic (congestive) heart failure: Secondary | ICD-10-CM

## 2012-08-13 NOTE — Patient Instructions (Addendum)
   Follow up in 4 months  Do the following things EVERYDAY: 1) Weigh yourself in the morning before breakfast. Write it down and keep it in a log. 2) Take your medicines as prescribed 3) Eat low salt foods-Limit salt (sodium) to 2000 mg per day.  4) Stay as active as you can everyday 5) Limit all fluids for the day to less than 2 liters 

## 2012-08-13 NOTE — Progress Notes (Signed)
Patient ID: Melanie Clements, female   DOB: 1917/04/24, 79 y.o.   MRN: [id number] Referring Physician: Dr. Debby Bud Primary Care: Dr. Debby Bud Primary Cardiologist: Dr. Graciela Husbands  HPI: Melanie Clements is a 39 y.o. AA female with past medical history significant for HTN, HL, CHB s/p St. Jude PPM 10/2011.  Combined diastolic/systolic  heart failure. ECHO 02/21/12 showed LVEF 35-40% with diffuse hypokinesis.  Mildly dilated LA.  RV severe hypertrophy with mildly reduced systolic function.  Mod dilated RA.  Mod TR.    She returns for follow up with her daughters, [name] and [name]. Last visit she was instructed to  hold lasix if her is 104 pound and take an additional 20 mg if weight is 111 pounds or greater.  Complains of abdominal bloating on occasion. . Denies SOB/PND/Orthopnea/CP. Weight at home 103-104 pounds. Complaint with medications. Ambulates with a walker. Appetite fair. Follows low salt diet. Limits fluid intake to < 2 liters.      Past Medical History  Diagnosis Date  . Acute upper gastrointestinal hemorrhage     2008  . Stiffness of joint, lower leg   . Constipation   . Weakness   . Dyspepsia   . Pulmonary nodule     right upper lobe  . Anemia   . Osteoporosis   . Glaucoma   . Hypertension   . Hyperlipidemia   . Sinoatrial node dysfunction   . CHF (congestive heart failure)     diastolic and systolic (EF 35-40%)    Current Outpatient Prescriptions  Medication Sig Dispense Refill  . aspirin 81 MG tablet Take 81 mg by mouth daily.      . brinzolamide (AZOPT) 1 % ophthalmic suspension Place 1 drop into both eyes 3 (three) times daily.      . calcium carbonate (TUMS LASTING EFFECTS) 500 MG chewable tablet Chew 1 tablet by mouth 2 (two) times daily as needed. For heartburn      . carvedilol (COREG) 3.125 MG tablet TAKE 1 TABLET (3.125 MG TOTAL) BY MOUTH 2 (TWO) TIMES DAILY WITH A MEAL.  60 tablet  3  . cholestyramine (QUESTRAN) 4 G packet Take 1 packet by mouth as needed.       .  ciprofloxacin (CIPRO) 250 MG tablet Take 1 tablet (250 mg total) by mouth 2 (two) times daily.  20 tablet  0  . enalapril (VASOTEC) 10 MG tablet TAKE 1 TABLET EVERY DAY  90 tablet  1  . furosemide (LASIX) 20 MG tablet Take 20 mg daily. Hold if weight is 104 pounds or less. Take an extra 20 mg if your weight is 111 pounds  60 tablet  5  . latanoprost (XALATAN) 0.005 % ophthalmic solution Place 1 drop into both eyes at bedtime.      Marland Kitchen loratadine (CLARITIN) 10 MG tablet Take 10 mg by mouth daily.        . mineral oil liquid Take 15 mLs by mouth daily as needed for constipation.  180 mL  12  . pantoprazole (PROTONIX) 40 MG tablet TAKE 1 TABLET BY MOUTH EVERY MORNING  90 tablet  1  . prednisoLONE acetate (PRED FORTE) 1 % ophthalmic suspension Place 1 drop into the right eye daily. In right eye      . promethazine-codeine (PHENERGAN WITH CODEINE) 6.25-10 MG/5ML syrup Take 5 mLs by mouth every 4 (four) hours as needed for cough.  120 mL  0  . sodium chloride (MURO 128) 5 % ophthalmic ointment 1 drop daily.  No current facility-administered medications for this encounter.    Allergies  Allergen Reactions  . Risedronate Sodium     REACTION: Diarrhea    History   Social History  . Marital Status: Widowed    Spouse Name: N/A    Number of Children: N/A  . Years of Education: N/A   Occupational History  . Not on file.   Social History Main Topics  . Smoking status: Never Smoker   . Smokeless tobacco: Never Used  . Alcohol Use: No  . Drug Use: No  . Sexually Active: No   Other Topics Concern  . Not on file   Social History Narrative   Pt lives alone but has a supportive family and someone is with her most of the time. Both parents and all her siblings are deceased, none with cardiac issues.Marland KitchenMarland KitchenShe has a very supportive family. remains I-ADLs except for driving..  ACP/End of Life - Care: discussed with patinet and dtr: DNR. Completed MOST - DNR, Limited Additional interventions,  Antibiotic with thoughtfullness, time trial of IVF and enteral nutrition.    No family history on file.  PHYSICAL EXAM: Filed Vitals:   08/13/12 1206  BP: 100/70  Pulse: 64  Weight: 105 lb (47.628 kg)  SpO2: 100%    General:  Elderly appearing. No respiratory difficulty 2 daughters present HEENT: normal Neck: supple. no JVD. Carotids 2+ bilat; no bruits. No lymphadenopathy or thryomegaly appreciated. Cor: PMI nondisplaced. Regular rate & rhythm. TR 3/6 Lungs: clear Abdomen: soft, nontender, nondistended. No hepatosplenomegaly. No bruits or masses. Good bowel sounds. Extremities: no cyanosis, clubbing, rash, edema Neuro: alert & oriented x 3, cranial nerves grossly intact. moves all 4 extremities w/o difficulty. Affect pleasant.    ASSESSMENT & PLAN:

## 2012-08-13 NOTE — Assessment & Plan Note (Addendum)
Volume status stable. Continue current diuretic regimen. Follow up in 4 months to reassess  Volume status.   Patient seen and examined with Tonye Becket, NP. We discussed all aspects of the encounter. I agree with the assessment and plan as stated above. She is doing well. Will continue current regimen.

## 2012-10-08 ENCOUNTER — Encounter: Payer: 59 | Admitting: Internal Medicine

## 2012-10-15 ENCOUNTER — Ambulatory Visit (INDEPENDENT_AMBULATORY_CARE_PROVIDER_SITE_OTHER): Payer: Medicare Other | Admitting: Internal Medicine

## 2012-10-15 ENCOUNTER — Encounter: Payer: Self-pay | Admitting: Internal Medicine

## 2012-10-15 VITALS — BP 127/76 | HR 60 | Ht 63.0 in | Wt 103.0 lb

## 2012-10-15 DIAGNOSIS — I5042 Chronic combined systolic (congestive) and diastolic (congestive) heart failure: Secondary | ICD-10-CM

## 2012-10-15 DIAGNOSIS — I495 Sick sinus syndrome: Secondary | ICD-10-CM

## 2012-10-15 DIAGNOSIS — Z950 Presence of cardiac pacemaker: Secondary | ICD-10-CM

## 2012-10-15 DIAGNOSIS — I509 Heart failure, unspecified: Secondary | ICD-10-CM

## 2012-10-15 LAB — PACEMAKER DEVICE OBSERVATION
BRDY-0002RV: 60 {beats}/min
BRDY-0004RV: 100 {beats}/min
RV LEAD AMPLITUDE: 8.1 mv
RV LEAD THRESHOLD: 0.75 V

## 2012-10-15 NOTE — Progress Notes (Signed)
Patient Care Team: Jacques Navy, MD as PCP - General   HPI  Melanie Clements is a 79 y.o. female Seen following pacemaker implantation for complete heart block-9/13  she had a prior history of dizziness and weakness but without syncope.  She is doing quite well. There has been no significant problems with shortness of breath. She has been very very compliant with her low sodium diet; as such most of her food she says these terrible. She is losing weight.  Her daughters describe her as being quite sleepy and tired most of the time there seems to be some correlation between good days being followed by bad days   Past Medical History  Diagnosis Date  . Acute upper gastrointestinal hemorrhage     2008  . Stiffness of joint, lower leg   . Constipation   . Weakness   . Dyspepsia   . Pulmonary nodule     right upper lobe  . Anemia   . Osteoporosis   . Glaucoma   . Hypertension   . Hyperlipidemia   . Sinoatrial node dysfunction   . CHF (congestive heart failure)     diastolic and systolic (EF 35-40%)    Past Surgical History  Procedure Laterality Date  . Cataract extraction    . Upper gastrointestinal endoscopy    . Colonoscopy      Current Outpatient Prescriptions  Medication Sig Dispense Refill  . aspirin 81 MG tablet Take 81 mg by mouth daily.      . brinzolamide (AZOPT) 1 % ophthalmic suspension Place 1 drop into both eyes 3 (three) times daily.      . calcium carbonate (TUMS LASTING EFFECTS) 500 MG chewable tablet Chew 1 tablet by mouth 2 (two) times daily as needed. For heartburn      . carvedilol (COREG) 3.125 MG tablet TAKE 1 TABLET (3.125 MG TOTAL) BY MOUTH 2 (TWO) TIMES DAILY WITH A MEAL.  60 tablet  3  . enalapril (VASOTEC) 10 MG tablet TAKE 1 TABLET EVERY DAY  90 tablet  1  . furosemide (LASIX) 20 MG tablet Take 20 mg daily. Hold if weight is 104 pounds or less. Take an extra 20 mg if your weight is 111 pounds  60 tablet  5  . latanoprost (XALATAN) 0.005 %  ophthalmic solution Place 1 drop into both eyes at bedtime.      Marland Kitchen loratadine (CLARITIN) 10 MG tablet Take 10 mg by mouth daily.        . mineral oil liquid Take 15 mLs by mouth daily as needed for constipation.  180 mL  12  . pantoprazole (PROTONIX) 40 MG tablet TAKE 1 TABLET BY MOUTH EVERY MORNING  90 tablet  1  . prednisoLONE acetate (PRED FORTE) 1 % ophthalmic suspension Place 1 drop into the right eye daily. In right eye      . promethazine-codeine (PHENERGAN WITH CODEINE) 6.25-10 MG/5ML syrup Take 5 mLs by mouth every 4 (four) hours as needed for cough.  120 mL  0   No current facility-administered medications for this visit.    Allergies  Allergen Reactions  . Risedronate Sodium     REACTION: Diarrhea    Review of Systems negative except from HPI and PMH  Physical Exam BP 127/76  Pulse 60  Ht 5\' 3"  (1.6 m)  Wt 103 lb (46.72 kg)  BMI 18.25 kg/m2 Well developed and well nourished in no acute distress HENT normal E scleral and icterus clear Neck Supple  RRR Device pocket well healed; without hematoma or erythema abdomen soft bowel sounds No clubbing cyanosis none Edema Alert and oriented, grossly normal motor and sensory function Skin Warm and Dry  electrocardiogram demonstrates  probable underlying atrial fibrillation with ventricular pacing

## 2012-10-15 NOTE — Assessment & Plan Note (Signed)
She is euvolemic. She is really struggling with eating because of pallor ability of food. I've encouraged her to use a little bit of sodium and or new salt

## 2012-10-15 NOTE — Assessment & Plan Note (Signed)
uncederlying pacing

## 2012-10-15 NOTE — Patient Instructions (Addendum)
Your physician wants you to follow-up in: 6 months with device clinic.  You will receive a reminder letter in the mail two months in advance. If you don't receive a letter, please call our office to schedule the follow-up appointment.   Your physician wants you to follow-up in: one year with Dr. Klein.  You will receive a reminder letter in the mail two months in advance. If you don't receive a letter, please call our office to schedule the follow-up appointment.   Your physician recommends that you continue on your current medications as directed. Please refer to the Current Medication list given to you today.   

## 2012-10-15 NOTE — Assessment & Plan Note (Signed)
The patient's device was interrogated.  The information was reviewed. No changes were made in the programming.    

## 2012-10-21 ENCOUNTER — Encounter: Payer: Self-pay | Admitting: Internal Medicine

## 2012-10-28 ENCOUNTER — Other Ambulatory Visit: Payer: Self-pay | Admitting: Internal Medicine

## 2012-10-29 ENCOUNTER — Other Ambulatory Visit (INDEPENDENT_AMBULATORY_CARE_PROVIDER_SITE_OTHER): Payer: 59

## 2012-10-29 ENCOUNTER — Encounter: Payer: Self-pay | Admitting: Internal Medicine

## 2012-10-29 ENCOUNTER — Ambulatory Visit (INDEPENDENT_AMBULATORY_CARE_PROVIDER_SITE_OTHER): Payer: Medicare Other | Admitting: Internal Medicine

## 2012-10-29 VITALS — BP 130/72 | HR 59 | Wt 104.0 lb

## 2012-10-29 DIAGNOSIS — K5900 Constipation, unspecified: Secondary | ICD-10-CM

## 2012-10-29 DIAGNOSIS — I10 Essential (primary) hypertension: Secondary | ICD-10-CM

## 2012-10-29 DIAGNOSIS — D649 Anemia, unspecified: Secondary | ICD-10-CM

## 2012-10-29 DIAGNOSIS — Z23 Encounter for immunization: Secondary | ICD-10-CM

## 2012-10-29 DIAGNOSIS — I5032 Chronic diastolic (congestive) heart failure: Secondary | ICD-10-CM

## 2012-10-29 LAB — COMPREHENSIVE METABOLIC PANEL
Albumin: 3.7 g/dL (ref 3.5–5.2)
Alkaline Phosphatase: 79 U/L (ref 39–117)
CO2: 26 mEq/L (ref 19–32)
GFR: 41.02 mL/min — ABNORMAL LOW (ref >60.00)
Glucose, Bld: 90 mg/dL (ref 70–99)
Potassium: 4.5 mEq/L (ref 3.5–5.1)
Sodium: 141 mEq/L (ref 135–145)
Total Protein: 7 g/dL (ref 6.0–8.3)

## 2012-10-29 LAB — CBC WITH DIFFERENTIAL/PLATELET
Basophils Absolute: 0 10*3/uL (ref 0.0–0.1)
Basophils Relative: 0.3 % (ref 0.0–3.0)
Eosinophils Absolute: 0.1 10*3/uL (ref 0.0–0.7)
Eosinophils Relative: 1.2 % (ref 0.0–5.0)
HCT: 40 % (ref 36.0–46.0)
Hemoglobin: 12.6 g/dL (ref 12.0–15.0)
Lymphocytes Relative: 27.1 % (ref 12.0–46.0)
Lymphs Abs: 1.9 10*3/uL (ref 0.7–4.0)
MCHC: 31.5 g/dL (ref 30.0–36.0)
MCV: 87.8 fl (ref 78.0–100.0)
Monocytes Absolute: 0.4 10*3/uL (ref 0.1–1.0)
Monocytes Relative: 5.1 % (ref 3.0–12.0)
Neutro Abs: 4.7 10*3/uL (ref 1.4–7.7)
Neutrophils Relative %: 66.3 % (ref 43.0–77.0)
Platelets: 202 10*3/uL (ref 150.0–400.0)
RBC: 4.55 Mil/uL (ref 3.87–5.11)
RDW: 15.7 % — ABNORMAL HIGH (ref 11.5–14.6)
WBC: 7.1 10*3/uL (ref 4.5–10.5)

## 2012-10-29 NOTE — Patient Instructions (Addendum)
1. Heart failure - doing well and right in line with the recommendations from the heart failure clinic  2. Pacemaker is doing well per Dr. Graciela Husbands  3. Weight loss - your dry weight as recommended by the heart failure clinic between 104- 111. Follow the instructions for dosing the lasix from the heart failure clinic. You should go to the class with the dietician as part of the heart failure clinic  4. Appetite and early satiety (feeling full easily) - it is better to have several small meals a day - 4 to 5.  5. Stomach issues - the belly pain has passed, all the studies were normal. OK to take mineral oil or Milk of magnesia as needed to insure a good bowel habit.  6. Knee/joint pain - it is ok to take tylenol 500 mg three times a day. We can prescribe a prescription rub if the pain gets worse and is not relieved by tylenol   7. Blood pressure -  BP Readings from Last 3 Encounters:  10/29/12 130/72  10/15/12 127/76  08/13/12 100/70   Good control on your present medications  8. Health maintenance - thank you for taking the flu shot

## 2012-10-29 NOTE — Progress Notes (Signed)
Subjective:    Patient ID: Melanie Clements, female    DOB: 06-15-1917, 79 y.o.   MRN: [id number]  HPI Chart reviewed: she has had 4 + visits to HF clinic: she was at dry weight and has parameters in regard to weight loss or gain and use of lasix. She has seen Dr. Graciela Husbands for PTVDP monitoring and is stable. She was seen by Dr. Roena Malady for abdominal pain with a normal exam and normal KUB. She did see Ms. Baity for cough.  At today's visit Melanie Clements has no new complaints. Two of her daughters are present and their main concern is her eating.   Past Medical History  Diagnosis Date  . Acute upper gastrointestinal hemorrhage     2008  . Stiffness of joint, lower leg   . Constipation   . Weakness   . Dyspepsia   . Pulmonary nodule     right upper lobe  . Anemia   . Osteoporosis   . Glaucoma   . Hypertension   . Hyperlipidemia   . Sinoatrial node dysfunction   . CHF (congestive heart failure)     diastolic and systolic (EF 35-40%)  . Pacemaker-St.Jude-single chamber 10/18/2011   Past Surgical History  Procedure Laterality Date  . Cataract extraction    . Upper gastrointestinal endoscopy    . Colonoscopy     History reviewed. No pertinent family history. History   Social History  . Marital Status: Widowed    Spouse Name: N/A    Number of Children: N/A  . Years of Education: N/A   Occupational History  . Not on file.   Social History Main Topics  . Smoking status: Never Smoker   . Smokeless tobacco: Never Used  . Alcohol Use: No  . Drug Use: No  . Sexual Activity: No   Other Topics Concern  . Not on file   Social History Narrative   Pt lives alone but has a supportive family and someone is with her most of the time. Both parents and all her siblings are deceased, none with cardiac issues.Marland KitchenMarland KitchenShe has a very supportive family. remains I-ADLs except for driving..  ACP/End of Life - Care: discussed with patinet and dtr: DNR. Completed MOST - DNR, Limited Additional  interventions, Antibiotic with thoughtfullness, time trial of IVF and enteral nutrition.     Current Outpatient Prescriptions on File Prior to Visit  Medication Sig Dispense Refill  . aspirin 81 MG tablet Take 81 mg by mouth daily.      . brinzolamide (AZOPT) 1 % ophthalmic suspension Place 1 drop into both eyes 3 (three) times daily.      . calcium carbonate (TUMS LASTING EFFECTS) 500 MG chewable tablet Chew 1 tablet by mouth 2 (two) times daily as needed. For heartburn      . carvedilol (COREG) 3.125 MG tablet TAKE 1 TABLET TWICE A DAY WITH FOOD  60 tablet  5  . enalapril (VASOTEC) 10 MG tablet TAKE 1 TABLET EVERY DAY  90 tablet  1  . furosemide (LASIX) 20 MG tablet Take 20 mg daily. Hold if weight is 104 pounds or less. Take an extra 20 mg if your weight is 111 pounds  60 tablet  5  . latanoprost (XALATAN) 0.005 % ophthalmic solution Place 1 drop into both eyes at bedtime.      Marland Kitchen loratadine (CLARITIN) 10 MG tablet Take 10 mg by mouth daily.        . mineral oil liquid Take 15  mLs by mouth daily as needed for constipation.  180 mL  12  . pantoprazole (PROTONIX) 40 MG tablet TAKE 1 TABLET BY MOUTH EVERY MORNING  90 tablet  1  . prednisoLONE acetate (PRED FORTE) 1 % ophthalmic suspension Place 1 drop into the right eye daily. In right eye      . promethazine-codeine (PHENERGAN WITH CODEINE) 6.25-10 MG/5ML syrup Take 5 mLs by mouth every 4 (four) hours as needed for cough.  120 mL  0   No current facility-administered medications on file prior to visit.         Review of Systems  Constitutional: Positive for fatigue. Negative for activity change and appetite change.  HENT: Negative.   Eyes: Negative.   Respiratory: Positive for cough. Negative for chest tightness, shortness of breath and wheezing.   Cardiovascular: Negative.   Gastrointestinal: Positive for constipation and abdominal distention. Negative for abdominal pain.  Endocrine: Negative.   Genitourinary: Negative.    Musculoskeletal: Negative.   Skin: Negative.   Allergic/Immunologic: Negative.   Neurological: Negative for dizziness, speech difficulty and weakness.  Hematological: Negative.   Psychiatric/Behavioral: Negative.        Objective:   Physical Exam Filed Vitals:   10/29/12 1316  BP: 130/72  Pulse: 59   Wt Readings from Last 3 Encounters:  10/29/12 104 lb (47.174 kg)  10/15/12 103 lb (46.72 kg)  08/13/12 105 lb (47.628 kg)   BP Readings from Last 3 Encounters:  10/29/12 130/72  10/15/12 127/76  08/13/12 100/70   Gen'l - thin elderly AA woman in no distress HEENT- C&S clear, PERRLA Cor - 2+ radial, RRR Pulm - CTAP Abd - BS+,  Neuro - A&O x 3       Assessment & Plan:

## 2012-10-30 NOTE — Assessment & Plan Note (Signed)
BP Readings from Last 3 Encounters:  10/29/12 130/72  10/15/12 127/76  08/13/12 100/70   Doing well. Plan is to continue present medications

## 2012-10-30 NOTE — Assessment & Plan Note (Signed)
Followed closesly in the HF clinic. She has been doing well and appear well compensated. She does need to be encouraged to liberalize her fluids a bit.

## 2012-11-01 ENCOUNTER — Encounter: Payer: Self-pay | Admitting: Internal Medicine

## 2012-12-13 ENCOUNTER — Other Ambulatory Visit: Payer: Self-pay | Admitting: Internal Medicine

## 2012-12-17 ENCOUNTER — Encounter: Payer: Self-pay | Admitting: Internal Medicine

## 2012-12-17 ENCOUNTER — Ambulatory Visit (INDEPENDENT_AMBULATORY_CARE_PROVIDER_SITE_OTHER): Payer: 59 | Admitting: Internal Medicine

## 2012-12-17 VITALS — BP 110/68 | HR 70 | Temp 96.5°F | Wt 106.8 lb

## 2012-12-17 DIAGNOSIS — M25569 Pain in unspecified knee: Secondary | ICD-10-CM

## 2012-12-17 DIAGNOSIS — M25561 Pain in right knee: Secondary | ICD-10-CM

## 2012-12-17 DIAGNOSIS — M542 Cervicalgia: Secondary | ICD-10-CM

## 2012-12-17 MED ORDER — DICLOFENAC SODIUM 1 % TD GEL: 4.0000 g | Freq: Four times a day (QID) | TRANSDERMAL | Status: DC

## 2012-12-17 NOTE — Progress Notes (Signed)
Pre visit review using our clinic review tool, if applicable. No additional management support is needed unless otherwise documented below in the visit note. 

## 2012-12-17 NOTE — Progress Notes (Signed)
Subjective:    Patient ID: Melanie Clements, female    DOB: 10-02-1917, 79 y.o.   MRN: [id number]  HPI Melanie Clements presents for a 5 day history of pain in the right ear and then seemed to move to the lower neck.  Her neck is tight and painful with extension of the head. No radiation of symptoms to UE. No lsos of hearing.  She c/o of rhinorrhea whenever she eats. She did have some improvement with "allergy" pills, antihistamine.   Past Medical History  Diagnosis Date  . Acute upper gastrointestinal hemorrhage     2008  . Stiffness of joint, lower leg   . Constipation   . Weakness   . Dyspepsia   . Pulmonary nodule     right upper lobe  . Anemia   . Osteoporosis   . Glaucoma   . Hypertension   . Hyperlipidemia   . Sinoatrial node dysfunction   . CHF (congestive heart failure)     diastolic and systolic (EF 35-40%)  . Pacemaker-St.Jude-single chamber 10/18/2011   Past Surgical History  Procedure Laterality Date  . Cataract extraction    . Upper gastrointestinal endoscopy    . Colonoscopy     History reviewed. No pertinent family history. History   Social History  . Marital Status: Widowed    Spouse Name: N/A    Number of Children: N/A  . Years of Education: N/A   Occupational History  . Not on file.   Social History Main Topics  . Smoking status: Never Smoker   . Smokeless tobacco: Never Used  . Alcohol Use: No  . Drug Use: No  . Sexual Activity: No   Other Topics Concern  . Not on file   Social History Narrative   Pt lives alone but has a supportive family and someone is with her most of the time. Both parents and all her siblings are deceased, none with cardiac issues.Marland KitchenMarland KitchenShe has a very supportive family. remains I-ADLs except for driving..  ACP/End of Life - Care: discussed with patinet and dtr: DNR. Completed MOST - DNR, Limited Additional interventions, Antibiotic with thoughtfullness, time trial of IVF and enteral nutrition.     Current Outpatient  Prescriptions on File Prior to Visit  Medication Sig Dispense Refill  . aspirin 81 MG tablet Take 81 mg by mouth daily.      . brinzolamide (AZOPT) 1 % ophthalmic suspension Place 1 drop into both eyes 3 (three) times daily.      . calcium carbonate (TUMS LASTING EFFECTS) 500 MG chewable tablet Chew 1 tablet by mouth 2 (two) times daily as needed. For heartburn      . carvedilol (COREG) 3.125 MG tablet TAKE 1 TABLET TWICE A DAY WITH FOOD  60 tablet  5  . enalapril (VASOTEC) 10 MG tablet TAKE 1 TABLET EVERY DAY  90 tablet  1  . furosemide (LASIX) 20 MG tablet Take 20 mg daily. Hold if weight is 104 pounds or less. Take an extra 20 mg if your weight is 111 pounds  60 tablet  5  . latanoprost (XALATAN) 0.005 % ophthalmic solution Place 1 drop into both eyes at bedtime.      Marland Kitchen loratadine (CLARITIN) 10 MG tablet Take 10 mg by mouth daily.        . mineral oil liquid Take 15 mLs by mouth daily as needed for constipation.  180 mL  12  . pantoprazole (PROTONIX) 40 MG tablet TAKE 1 TABLET BY MOUTH  EVERY MORNING  90 tablet  1  . promethazine-codeine (PHENERGAN WITH CODEINE) 6.25-10 MG/5ML syrup Take 5 mLs by mouth every 4 (four) hours as needed for cough.  120 mL  0   No current facility-administered medications on file prior to visit.      Review of Systems System review is negative for any constitutional, cardiac, pulmonary, GI or neuro symptoms or complaints other than as described in the HPI.     Objective:   Physical Exam Filed Vitals:   12/17/12 1617  BP: 110/68  Pulse: 70  Temp: 96.5 F (35.8 C)   Wt Readings from Last 3 Encounters:  12/17/12 106 lb 12.8 oz (48.444 kg)  10/29/12 104 lb (47.174 kg)  10/15/12 103 lb (46.72 kg)   Gen'l Very elderly and frail woman in no acute distress. HEENT- EAC with some cerumen but no impaction Neck - no mass, no adenopathy, decreased ROM Cor- RRR PUlm - CTAP       Assessment & Plan:  1. Neck pain - no sign of infection, no mass, no  enlarged lymph nodes, no jaw problems. The pain appears to be related to arthritis of the neck  Plan Tylenol 500 mg three times a day on schedule to help the pain  Rub of choice: BenGay, aspercreme, etc  Heat.  2. Knee pain - old arthur again   Plan Same as above  Rx provided for voltaren gel 1% - rub in 4 g to each knee 4 times a day.

## 2012-12-17 NOTE — Patient Instructions (Signed)
1. Neck pain - no sign of infection, no mass, no enlarged lymph nodes, no jaw problems. The pain appears to be related to arthritis of the neck  Plan Tylenol 500 mg three times a day on schedule to help the pain  Rub of choice: BenGay, aspercreme, etc  Heat.  2. Knee pain - old arthur again   Plan Same as above  Rx provided for voltaren gel 1% - rub in 4 g to each knee 4 times a day.

## 2013-01-01 ENCOUNTER — Encounter (HOSPITAL_COMMUNITY): Payer: 59

## 2013-01-04 ENCOUNTER — Other Ambulatory Visit: Payer: Self-pay | Admitting: Internal Medicine

## 2013-01-07 ENCOUNTER — Ambulatory Visit (HOSPITAL_COMMUNITY)
Admission: RE | Admit: 2013-01-07 | Discharge: 2013-01-07 | Disposition: A | Discharge status: Home / Self Care | Payer: Medicare Other | Source: Ambulatory Visit | Attending: Internal Medicine | Admitting: Internal Medicine

## 2013-01-07 ENCOUNTER — Encounter (HOSPITAL_COMMUNITY): Payer: Self-pay

## 2013-01-07 VITALS — BP 126/78 | HR 59 | Wt 105.1 lb

## 2013-01-07 DIAGNOSIS — R0989 Other specified symptoms and signs involving the circulatory and respiratory systems: Secondary | ICD-10-CM | POA: Insufficient documentation

## 2013-01-07 DIAGNOSIS — I495 Sick sinus syndrome: Secondary | ICD-10-CM | POA: Insufficient documentation

## 2013-01-07 DIAGNOSIS — Z950 Presence of cardiac pacemaker: Secondary | ICD-10-CM | POA: Insufficient documentation

## 2013-01-07 DIAGNOSIS — I509 Heart failure, unspecified: Secondary | ICD-10-CM | POA: Insufficient documentation

## 2013-01-07 DIAGNOSIS — R0609 Other forms of dyspnea: Secondary | ICD-10-CM | POA: Insufficient documentation

## 2013-01-07 DIAGNOSIS — Z7982 Long term (current) use of aspirin: Secondary | ICD-10-CM | POA: Insufficient documentation

## 2013-01-07 DIAGNOSIS — I517 Cardiomegaly: Secondary | ICD-10-CM | POA: Insufficient documentation

## 2013-01-07 DIAGNOSIS — Z79899 Other long term (current) drug therapy: Secondary | ICD-10-CM | POA: Insufficient documentation

## 2013-01-07 DIAGNOSIS — I5042 Chronic combined systolic (congestive) and diastolic (congestive) heart failure: Secondary | ICD-10-CM

## 2013-01-07 DIAGNOSIS — E785 Hyperlipidemia, unspecified: Secondary | ICD-10-CM | POA: Insufficient documentation

## 2013-01-07 DIAGNOSIS — I5022 Chronic systolic (congestive) heart failure: Secondary | ICD-10-CM | POA: Insufficient documentation

## 2013-01-07 DIAGNOSIS — I10 Essential (primary) hypertension: Secondary | ICD-10-CM

## 2013-01-07 NOTE — Patient Instructions (Signed)
Can take extra Lasix as needed  We will contact you in 6 months to schedule your next appointment.

## 2013-01-07 NOTE — Progress Notes (Signed)
Patient ID: Melanie Clements, female   DOB: 06-15-1917, 79 y.o.   MRN: [id number]  Referring Physician: Dr. Debby Bud Primary Care: Dr. Debby Bud Primary Cardiologist: Dr. Graciela Husbands  HPI:  Melanie Clements is a 65 y.o. AA female with past medical history significant for HTN, HL, CHB s/p St. Jude PPM 10/2011.  Combined diastolic/systolic  heart failure. ECHO 02/21/12 showed LVEF 35-40% with diffuse hypokinesis.There was severe biventricular hypertrophy.  RV with mildly reduced systolic function.  Mod dilated RA.  Mod TR.  No PAH.  She returns for follow up with her daughters, Melanie Clements and Melanie Clements.  Overall doing ok. Does have some dyspnea with exertion but just takes breaks as needed. No chest pain or pressure. No orthopnea or PND, Weight stable at home. Takes her lasix every day. Takes an extra pill about once every month or so when she feels like she is swelling. She feels like her fluid is up a bit today. Mild edema. No dizziness. Walks with walker.   Past Medical History  Diagnosis Date  . Acute upper gastrointestinal hemorrhage     2008  . Stiffness of joint, lower leg   . Constipation   . Weakness   . Dyspepsia   . Pulmonary nodule     right upper lobe  . Anemia   . Osteoporosis   . Glaucoma   . Hypertension   . Hyperlipidemia   . Sinoatrial node dysfunction   . CHF (congestive heart failure)     diastolic and systolic (EF 35-40%)  . Pacemaker-St.Jude-single chamber 10/18/2011    Current Outpatient Prescriptions  Medication Sig Dispense Refill  . aspirin 81 MG tablet Take 81 mg by mouth daily.      . brinzolamide (AZOPT) 1 % ophthalmic suspension Place 1 drop into both eyes 3 (three) times daily.      . calcium carbonate (TUMS LASTING EFFECTS) 500 MG chewable tablet Chew 1 tablet by mouth 2 (two) times daily as needed. For heartburn      . carvedilol (COREG) 3.125 MG tablet TAKE 1 TABLET TWICE A DAY WITH FOOD  60 tablet  5  . diclofenac sodium (VOLTAREN) 1 % GEL Apply 4 g topically 4 (four) times  daily.  10 Tube  3  . enalapril (VASOTEC) 10 MG tablet TAKE 1 TABLET EVERY DAY  90 tablet  1  . furosemide (LASIX) 20 MG tablet Take 20 mg daily. Hold if weight is 104 pounds or less. Take an extra 20 mg if your weight is 111 pounds  60 tablet  5  . latanoprost (XALATAN) 0.005 % ophthalmic solution Place 1 drop into both eyes at bedtime.      Marland Kitchen loratadine (CLARITIN) 10 MG tablet Take 10 mg by mouth daily.        Marland Kitchen loteprednol (LOTEMAX) 0.5 % ophthalmic suspension Place 1 drop into the right eye 4 (four) times daily.      . mineral oil liquid Take 15 mLs by mouth daily as needed for constipation.  180 mL  12  . pantoprazole (PROTONIX) 40 MG tablet TAKE 1 TABLET BY MOUTH EVERY MORNING  90 tablet  1  . promethazine-codeine (PHENERGAN WITH CODEINE) 6.25-10 MG/5ML syrup Take 5 mLs by mouth every 4 (four) hours as needed for cough.  120 mL  0   No current facility-administered medications for this encounter.    Allergies  Allergen Reactions  . Risedronate Sodium     REACTION: Diarrhea    History   Social History  .  Marital Status: Widowed    Spouse Name: N/A    Number of Children: N/A  . Years of Education: N/A   Occupational History  . Not on file.   Social History Main Topics  . Smoking status: Never Smoker   . Smokeless tobacco: Never Used  . Alcohol Use: No  . Drug Use: No  . Sexual Activity: No   Other Topics Concern  . Not on file   Social History Narrative   Pt lives alone but has a supportive family and someone is with her most of the time. Both parents and all her siblings are deceased, none with cardiac issues.Marland KitchenMarland KitchenShe has a very supportive family. remains I-ADLs except for driving..  ACP/End of Life - Care: discussed with patinet and dtr: DNR. Completed MOST - DNR, Limited Additional interventions, Antibiotic with thoughtfullness, time trial of IVF and enteral nutrition.    No family history on file.  PHYSICAL EXAM: Filed Vitals:   01/07/13 1101  BP: 126/78  Pulse:  59  Weight: 105 lb 1.9 oz (47.682 kg)  SpO2: 100%    General:  Elderly. Thin well appearing. No respiratory difficulty 2 daughters present HEENT: normal Neck: supple. JVP 7-8. Carotids 2+ bilat; no bruits. No lymphadenopathy or thryomegaly appreciated. Cor: PMI nondisplaced. Regular rate & rhythm. TR 3/6 Lungs: clear Abdomen: soft, nontender, minimally distended. No hepatosplenomegaly. No bruits or masses. Good bowel sounds. Extremities: no cyanosis, clubbing, rash, mild ankle edema Neuro: alert & oriented x 3, cranial nerves grossly intact. moves all 4 extremities w/o difficulty. Affect pleasant.   ASSESSMENT & PLAN: 1. Chronic systolic HF, unclear etiology 2. Biventricular hypertrophy 3. Sinus node dysfunction, s/p PPM 4. HTN, well controlled  Overall doing very well. Has mild volume overload. Instructed her to take extra lasix pill today. Reinforced need for daily weights and reviewed use of sliding scale diuretics. Etiology of biventricular hypertrophy may be due to HTN but could also be infiltrative process like amyloid. I reviewed previous ECG looking at voltage but rhythm was v-paced so unable to look for low voltage. Would not increase HF meds at this point due to risk of hypotension.   Melanie Harter,MD 11:35 AM

## 2013-01-07 NOTE — Addendum Note (Signed)
Encounter addended by: Noralee Space, RN on: 01/07/2013 11:40 AM<BR>     Documentation filed: Patient Instructions Section

## 2013-01-22 ENCOUNTER — Ambulatory Visit (INDEPENDENT_AMBULATORY_CARE_PROVIDER_SITE_OTHER): Payer: 59 | Admitting: Internal Medicine

## 2013-01-22 ENCOUNTER — Encounter: Payer: Self-pay | Admitting: Internal Medicine

## 2013-01-22 VITALS — BP 120/76 | HR 59 | Wt 106.0 lb

## 2013-01-22 DIAGNOSIS — I5032 Chronic diastolic (congestive) heart failure: Secondary | ICD-10-CM

## 2013-01-22 NOTE — Progress Notes (Signed)
   Subjective:    Patient ID: Melanie Clements, female    DOB: 02/21/1917, 79 y.o.   MRN: [id number]  HPI [name] presents for evaluation of several c/o;Puffy feet; hair loss; SOB. She has had recent cardiology visit and was stable re: CHF (Dr Senaida Lange note reviewed). She does not appear to be in distress today.  PMH, FamHx and SocHx reviewed for any changes and relevance. Current Outpatient Prescriptions on File Prior to Visit  Medication Sig Dispense Refill  . aspirin 81 MG tablet Take 81 mg by mouth daily.      . brinzolamide (AZOPT) 1 % ophthalmic suspension Place 1 drop into both eyes 3 (three) times daily.      . calcium carbonate (TUMS LASTING EFFECTS) 500 MG chewable tablet Chew 1 tablet by mouth 2 (two) times daily as needed. For heartburn      . carvedilol (COREG) 3.125 MG tablet TAKE 1 TABLET TWICE A DAY WITH FOOD  60 tablet  5  . diclofenac sodium (VOLTAREN) 1 % GEL Apply 4 g topically 4 (four) times daily.  10 Tube  3  . enalapril (VASOTEC) 10 MG tablet TAKE 1 TABLET EVERY DAY  90 tablet  1  . furosemide (LASIX) 20 MG tablet Take 20 mg daily. Hold if weight is 104 pounds or less. Take an extra 20 mg if your weight is 111 pounds  60 tablet  5  . latanoprost (XALATAN) 0.005 % ophthalmic solution Place 1 drop into both eyes at bedtime.      Marland Kitchen loratadine (CLARITIN) 10 MG tablet Take 10 mg by mouth daily.        Marland Kitchen loteprednol (LOTEMAX) 0.5 % ophthalmic suspension Place 1 drop into the right eye daily.       . mineral oil liquid Take 15 mLs by mouth daily as needed for constipation.  180 mL  12  . pantoprazole (PROTONIX) 40 MG tablet TAKE 1 TABLET BY MOUTH EVERY MORNING  90 tablet  1  . promethazine-codeine (PHENERGAN WITH CODEINE) 6.25-10 MG/5ML syrup Take 5 mLs by mouth every 4 (four) hours as needed for cough.  120 mL  0   No current facility-administered medications on file prior to visit.      Review of Systems System review is negative for any constitutional, cardiac,  pulmonary, GI or neuro symptoms or complaints other than as described in the HPI.     Objective:   Physical Exam Filed Vitals:   01/22/13 1312  BP: 120/76  Pulse: 59   Wt Readings from Last 3 Encounters:  01/22/13 106 lb (48.081 kg)  01/07/13 105 lb 1.9 oz (47.682 kg)  12/17/12 106 lb 12.8 oz (48.444 kg)   BP Readings from Last 3 Encounters:  01/22/13 120/76  01/07/13 126/78  12/17/12 110/68   Gen'l - very elderly, very thin AA woman in no distress HEENT- temporal wasting noted. C&S clear Cor - 2+ radial pulse, RRR, no JVD Pulm - good breath sounds throughout, no rales, no increased WOB Neuro - awake and alert, feisty. Speech is clear and cognition seems normal.       Assessment & Plan:  Hair loss - Your hair is not coming out when I do a "Tug" test. You may be loosing a little hair, possibly due to medication. However, we do not want to change medications.

## 2013-01-22 NOTE — Progress Notes (Signed)
Pre visit review using our clinic review tool, if applicable. No additional management support is needed unless otherwise documented below in the visit note. 

## 2013-01-22 NOTE — Patient Instructions (Signed)
Thank you for coming in.   Your hair is not coming out when I do a "Tug" test. You may be loosing a little hair, possibly due to medication. However, we do not want to change medications.  There is a little fluid in your legs but Dr. B does not want a increase in your water pill. It could bring your blood pressure too low. There is no fluid build up in your lungs.  You have a cardiomyopathy (weak heart) which is being maximally treated with medication. But, medication cannot make your heart that much stronger. Any time you do any activity you will be short of breath due to this weakness.   Try to eat a few more calories.  Have a healthy and happy holiday season.

## 2013-01-25 NOTE — Assessment & Plan Note (Addendum)
There is a little fluid in your legs but Dr. B does not want a increase in your water pill. It could bring your blood pressure too low. There is no fluid build up in your lungs. You have a cardiomyopathy (weak heart) which is being maximally treated with medication. But, medication cannot make your heart that much stronger. Any time you do any activity you will be short of breath due to this weakness.

## 2013-02-11 ENCOUNTER — Telehealth: Payer: Self-pay | Admitting: Internal Medicine

## 2013-02-11 NOTE — Telephone Encounter (Signed)
Sertraline 25 mg once a day. #30 refill x 3

## 2013-02-11 NOTE — Telephone Encounter (Signed)
Patient Information:  Caller Name: Dondra Clements  Phone: [phone]  Patient: Melanie Clements, Melanie G  Gender: Female  DOB: 10/16/1917  Age: 80 Years  PCP: Illene RegulusNorins, Michael (Adults only)  Office Follow Up:  Does the office need to follow up with this patient?: Yes  Instructions For The Office: Please see note for light sedative.   Symptoms  Reason For Call & Symptoms: Pt has CHF.  She is having SOB, complaining of abdominal pain, glaucoma, not sleeping well.  She is constantly complaining.  They have been giving her an extra fluid pills.  They are asking if she can have a mild sedative for anxiety prn.  Triaged Anxiety in [facility] and all questions negative. Family wants to know if Dr. will consider given pt a light sedative?  Reviewed Health History In EMR: Yes  Reviewed Medications In EMR: Yes  Reviewed Allergies In EMR: Yes  Reviewed Surgeries / Procedures: Yes  Date of Onset of Symptoms: 01/11/2013  Guideline(s) Used:  No Protocol Available - Information Only  Disposition Per Guideline:   Home Care  Reason For Disposition Reached:   Information only question and nurse able to answer  Advice Given:  N/A  Patient Refused Recommendation:  Patient Requests Prescription  Requesting a RX for a light sedative.  Pt is constantly complaining about her medical problems/losses.

## 2013-02-12 ENCOUNTER — Other Ambulatory Visit: Payer: Self-pay | Admitting: *Deleted

## 2013-02-12 MED ORDER — SERTRALINE HCL 50 MG PO TABS: ORAL_TABLET | ORAL | Status: DC

## 2013-02-28 ENCOUNTER — Other Ambulatory Visit: Payer: Self-pay | Admitting: Internal Medicine

## 2013-03-12 ENCOUNTER — Encounter: Payer: Self-pay | Admitting: Internal Medicine

## 2013-03-12 ENCOUNTER — Ambulatory Visit (INDEPENDENT_AMBULATORY_CARE_PROVIDER_SITE_OTHER): Payer: 59 | Admitting: Internal Medicine

## 2013-03-12 ENCOUNTER — Ambulatory Visit: Payer: Medicare Other

## 2013-03-12 VITALS — BP 96/50 | HR 60 | Temp 94.5°F | Wt 110.0 lb

## 2013-03-12 DIAGNOSIS — R1310 Dysphagia, unspecified: Secondary | ICD-10-CM

## 2013-03-12 DIAGNOSIS — M25669 Stiffness of unspecified knee, not elsewhere classified: Secondary | ICD-10-CM

## 2013-03-12 DIAGNOSIS — I5032 Chronic diastolic (congestive) heart failure: Secondary | ICD-10-CM

## 2013-03-12 DIAGNOSIS — I10 Essential (primary) hypertension: Secondary | ICD-10-CM

## 2013-03-12 DIAGNOSIS — Z7189 Other specified counseling: Secondary | ICD-10-CM

## 2013-03-12 DIAGNOSIS — R5383 Other fatigue: Secondary | ICD-10-CM | POA: Insufficient documentation

## 2013-03-12 DIAGNOSIS — E279 Disorder of adrenal gland, unspecified: Secondary | ICD-10-CM

## 2013-03-12 DIAGNOSIS — R5381 Other malaise: Secondary | ICD-10-CM

## 2013-03-12 NOTE — Assessment & Plan Note (Signed)
Patient has significant pretibial and pedal edema on exam this afternoon. Posterior tibial pulse 1+ and unable to appreciate dorsalis pedis pulse bilaterally due to swelling. Per cardiology, she should take an extra 20mg  furosemide tablet if she weighs over 111 pounds. Today weighed 110. Continue cardiology algorithm for fluid pills. Follow up for acute changes.

## 2013-03-12 NOTE — Assessment & Plan Note (Addendum)
Patient has difficulty swallowing. Will set up an appointment for esophogram.

## 2013-03-12 NOTE — Progress Notes (Signed)
Subjective:     Patient ID: Melanie Clements, female   DOB: 11/25/1917, 80 y.o.   MRN: [id number]  HPI  Eating habits - family says that patient has been eating well, but pt says that she hasn't been eating as well as she could. She's weighs 110 pounds. Now doesn't drink so much because she is on a 1.5L fluid restrictions per day on cardiology's recommendation. In addition to feeling like she has lost weight, she complains of leg and foot swelling. Cardiology tells her to take an additional fluid pill if she weighs over 111 pounds. She complains of sore knees and arthritis.    Past Medical History  Diagnosis Date  . Acute upper gastrointestinal hemorrhage     2008  . Stiffness of joint, lower leg   . Constipation   . Weakness   . Dyspepsia   . Pulmonary nodule     right upper lobe  . Anemia   . Osteoporosis   . Glaucoma   . Hypertension   . Hyperlipidemia   . Sinoatrial node dysfunction   . CHF (congestive heart failure)     diastolic and systolic (EF 35-40%)  . Pacemaker-St.Jude-single chamber 10/18/2011   Past Surgical History  Procedure Laterality Date  . Cataract extraction    . Upper gastrointestinal endoscopy    . Colonoscopy     History reviewed. No pertinent family history. History   Social History  . Marital Status: Widowed    Spouse Name: N/A    Number of Children: N/A  . Years of Education: N/A   Occupational History  . Not on file.   Social History Main Topics  . Smoking status: Never Smoker   . Smokeless tobacco: Never Used  . Alcohol Use: No  . Drug Use: No  . Sexual Activity: No   Other Topics Concern  . Not on file   Social History Narrative   Pt lives alone but has a supportive family and someone is with her most of the time. Both parents and all her siblings are deceased, none with cardiac issues.Marland Kitchen.Marland Kitchen.She has a very supportive family. remains I-ADLs except for driving..  ACP/End of Life - Care: discussed with patinet and dtr: DNR. Completed MOST -  DNR, Limited Additional interventions, Antibiotic with thoughtfullness, time trial of IVF and enteral nutrition.     . Review of Systems Constitutional:  Negative for fever, chills, activity change and unexpected weight change.  HEENT:  Negative for hearing loss, ear pain, congestion, neck stiffness and postnasal drip. Negative for sore throat or swallowing problems. Negative for dental complaints.   Eyes: Negative for vision loss or change in visual acuity.  Respiratory: Negative for chest tightness and wheezing.  Cardiovascular: Negative for chest pain or palpitations. No decreased exercise tolerance Gastrointestinal: Some episodes of diarrhea.  Musculoskeletal: Negative for myalgias, back pain, arthralgias and gait problem.  Endorses arthritis and leg swelling.  Neurological: Endorses leg weakness and loss of energy.   Psychiatric/Behavioral: Negative for behavioral problems and dysphoric mood. Pt has been falling asleep during breakfast for the last two weeks.       Objective:   Physical Exam Filed Vitals:   03/12/13 1521  BP: 96/50  Pulse: 60  Temp: 94.5 F (34.7 C)     General: Well developed, well nourished, NAD, appears stated age HEENT: NCAT, PERRLA, EOMI, Anicteic Sclera, mucous membranes moist.  Neck: Supple, no JVD, no masses  Cardiovascular: S1 S2 auscultated, no rubs, murmurs or gallops. Regular rate and  rhythm.  Respiratory: Clear to auscultation bilaterally with equal chest rise  Abdomen: Soft, nontender, nondistended, + bowel sounds  Extremities: warm, dry without cyanosis, clubbing. Significant pretibial and pedal edema  Neuro: Alert and Oriented x2, could not recall month, cranial nerves II-XII grossly intact.  Skin: Without rashes, exudates, or nodules  Psych: Normal mood and affect with intact judgement and insight     Assessment:     Per problem list     Plan:     Per problem list

## 2013-03-12 NOTE — Progress Notes (Signed)
Pre visit review using our clinic review tool, if applicable. No additional management support is needed unless otherwise documented below in the visit note. 

## 2013-03-12 NOTE — Assessment & Plan Note (Signed)
Patient reports significant joint stiffness in lower extremities. Encouraged to take 500mg  tylenol TID to help alleviate stiffness and reduce pain.

## 2013-03-12 NOTE — Patient Instructions (Signed)
It has been my pleasure providing medical care to you today.  1) Leg and Foot Swelling - Follow the recommendations of cardiology - at 111 pounds, take an extra Lasix (furosemide) pill.  - Will not increase the standing dose at this time because her lungs are clear and blood pressure is low. - If your breathing gets worse, you develop shortness of breath, or you start coughing up white sputum, please return to the office.    2) Low Energy - Restart Zoloft (sertraline) 25mg , take the pills in the morning instead of the evening.   3) Difficulty swallowing - We will order a swallowing study (esophogram) to evaluate your swallowing muscles.   4) Home Health - We will put in a referral to a home health agency for an evaluation.

## 2013-03-12 NOTE — Assessment & Plan Note (Signed)
Patient was started on sertraline 25mg  in December, but family stopped medicine after one day when the patient was groggy. Family could not be sure that the medicine was the cause of the groggy sensation, however, so will try sertraline 25 mg again and watch for her response.

## 2013-03-13 ENCOUNTER — Telehealth: Payer: Self-pay | Admitting: Internal Medicine

## 2013-03-13 ENCOUNTER — Telehealth: Payer: Self-pay

## 2013-03-13 DIAGNOSIS — R300 Dysuria: Secondary | ICD-10-CM

## 2013-03-13 LAB — BASIC METABOLIC PANEL
BUN: 38 mg/dL — AB (ref 6–23)
CHLORIDE: 107 meq/L (ref 96–112)
CO2: 30 meq/L (ref 19–32)
Calcium: 9.5 mg/dL (ref 8.4–10.5)
Creatinine, Ser: 1.6 mg/dL — ABNORMAL HIGH (ref 0.4–1.2)
GFR: 37.55 mL/min — AB (ref >60.00)
Glucose, Bld: 88 mg/dL (ref 70–99)
Potassium: 3.9 mEq/L (ref 3.5–5.1)
Sodium: 144 mEq/L (ref 135–145)

## 2013-03-13 NOTE — Telephone Encounter (Signed)
Reviewed note - no urinary track symptoms, there was no discussion of getting a U/A nor were there signs of infection.  Order is entered for U/A-diagnosis dysuria.  See other phone note- home health nurse wanting more information about referral that was requested for this patient - ok to fax copy of office note from 03/12/13

## 2013-03-13 NOTE — Telephone Encounter (Signed)
Patient's daughter is calling to request that an order be put in so that the patient can do her urine test as Dr. Debby BudNorins requested. She states that they were in yesterday and went down to the lab but the orders were not in so they the patient was given a cup to take home to collect urine. Pt's daughter wants to make sure the order is in so that when they take the collection to the lab the orders will be in. Please call daughter, Dondra SpryGail, at [phone] when complete.

## 2013-03-13 NOTE — Assessment & Plan Note (Signed)
Mrs. Melanie Clements with progressive decline: poor appetite, worsening heart failure becoming more difficult to manage, increased somnolence. She does not appear to be uncomfortable and has a strong support system with two very attentive daughters.

## 2013-03-13 NOTE — Assessment & Plan Note (Signed)
BP Readings from Last 3 Encounters:  03/12/13 96/50  01/22/13 120/76  01/07/13 126/78   Hypotensive today but asymptomatic. Will not increase diuretic due to low SBP.  Plan Careful monitoring BP vs Weight and CHF - will order HH-RN eval and monitoring.

## 2013-03-13 NOTE — Telephone Encounter (Signed)
Burna MortimerWanda at [facility] called and is hoping to get additional information regarding the referral   Callback - [phone]

## 2013-03-14 ENCOUNTER — Encounter: Payer: Self-pay | Admitting: Internal Medicine

## 2013-03-14 ENCOUNTER — Other Ambulatory Visit: Payer: Medicare Other

## 2013-03-14 ENCOUNTER — Telehealth: Payer: Self-pay | Admitting: *Deleted

## 2013-03-14 DIAGNOSIS — R300 Dysuria: Secondary | ICD-10-CM

## 2013-03-14 LAB — URINALYSIS, ROUTINE W REFLEX MICROSCOPIC
Bilirubin Urine: NEGATIVE
Hgb urine dipstick: NEGATIVE
KETONES UR: NEGATIVE
LEUKOCYTES UA: NEGATIVE
NITRITE: NEGATIVE
PH: 5.5 (ref 5.0–8.0)
SPECIFIC GRAVITY, URINE: 1.015 (ref 1.000–1.030)
Total Protein, Urine: NEGATIVE
Urine Glucose: NEGATIVE
Urobilinogen, UA: 0.2 (ref 0.0–1.0)

## 2013-03-14 NOTE — Telephone Encounter (Signed)
Left a message on voicemail with daughter that order has been placed

## 2013-03-14 NOTE — Telephone Encounter (Signed)
Aram Beechamynthia, [facility] phoned to give PCP update on patient status after today's assessment:  Lungs clear; afebrile, good appetite, stable, no physical therapy, and meds verified.  If further orders needed, please advise.  Aram BeechamCynthia CB# [phone]

## 2013-03-18 NOTE — Telephone Encounter (Signed)
error 

## 2013-03-20 ENCOUNTER — Telehealth: Payer: Self-pay | Admitting: *Deleted

## 2013-03-20 NOTE — Telephone Encounter (Signed)
k for speech therapy as requested

## 2013-03-20 NOTE — Telephone Encounter (Signed)
Luisa HartWendy Bradshaw, speech therapist with [facility], phoning requesting speech therapy services for patient 2/week for 3 weeks for dysphagia.  Please advise.  CB# [phone]

## 2013-03-21 ENCOUNTER — Telehealth: Payer: Self-pay | Admitting: *Deleted

## 2013-03-21 NOTE — Telephone Encounter (Signed)
Phoned Luisa HartWendy Bradshaw, ST with [facility], and left voicemail message with requested verbal order per MD.

## 2013-03-21 NOTE — Telephone Encounter (Signed)
Hughes Betterynthia Stephens, RN was calling to notify PCP that patient had been gaining weight of a daily average of 1lb/day (110lb today); lungs clear, +3 bilateral non-pitting pedal edema; dyspneic when ambulating/exertion. BP 140 SBP.  Do you wish for her to take an extra dose today (or wait until tomorrow when she weighs 111)?  Also, she was inquiring about 03/14/13 urinalysis results.  CB# Aram Beecham(Cynthia, RN) [phone]

## 2013-03-21 NOTE — Telephone Encounter (Signed)
U/a negative Give additional dose of lasix for 2 days if SBP greater tan 90

## 2013-03-21 NOTE — Telephone Encounter (Signed)
Phoned Hughes Betterynthia Stephens, RN and relayed MD instructions Delmer IslamVORB

## 2013-03-24 ENCOUNTER — Telehealth: Payer: Self-pay | Admitting: *Deleted

## 2013-03-24 NOTE — Telephone Encounter (Signed)
Notified Robin with [facility] of MD response.

## 2013-03-24 NOTE — Telephone Encounter (Signed)
It is interesting that the patient has never had an episode of gout.  OK to give indocin (indomethacin) 25 mg TID for no more than 5 days. (#15)  Continue protonix daily

## 2013-03-24 NOTE — Telephone Encounter (Signed)
Fleet Contrasachel, with [facility], phoned, after seeing patient for a gout flare up, requesting to confirm/clarify with PCP if it would NOT be a contraindication for podiatrist to prescribe Indocine 50 mg PO BID secondary to hx & patient's age (I spoke with actual podiatrist-Dr. Fanny DanceJah)  Please advise.  CB# [phone]

## 2013-03-26 ENCOUNTER — Telehealth: Payer: Self-pay | Admitting: Internal Medicine

## 2013-03-26 NOTE — Telephone Encounter (Signed)
Dtr concerned about mother's HR and weakness. Informed her that mother's HR has been documented below 60 on several occassions. Also discussed with device clinic - this may be related to premature beats. (Last device checked only showed a couple). Dtr also explains that they recently saw PCP Dr. Debby BudNorins about weakness issue.  Advised to keep device check 3/9 and to call back if issues worsen or pt becomes symptomatic with new symptoms. Dtr is agreeable to plan.

## 2013-03-26 NOTE — Telephone Encounter (Signed)
New Message  Pt daughter called states she is experiencing weakness.// Home Health nurse states that s Heart Rate is low of 56.Marland Kitchen. Pt daughter is requesting a call back to discuss// please assist

## 2013-03-27 DIAGNOSIS — R1310 Dysphagia, unspecified: Secondary | ICD-10-CM

## 2013-03-27 DIAGNOSIS — I10 Essential (primary) hypertension: Secondary | ICD-10-CM

## 2013-03-27 DIAGNOSIS — M25669 Stiffness of unspecified knee, not elsewhere classified: Secondary | ICD-10-CM

## 2013-03-27 DIAGNOSIS — I509 Heart failure, unspecified: Secondary | ICD-10-CM

## 2013-03-28 ENCOUNTER — Inpatient Hospital Stay (HOSPITAL_COMMUNITY): Admission: RE | Admit: 2013-03-28 | Payer: 59 | Source: Ambulatory Visit

## 2013-03-28 ENCOUNTER — Telehealth: Payer: Self-pay | Admitting: *Deleted

## 2013-03-28 NOTE — Telephone Encounter (Signed)
Hughes Betterynthia Stephens, RN, phoned with the following info on today's [facility]:   95.7 60 108/66 95%   110.8 lbs  Lungs clear & diminished (no crackles)  +2 pitting BLE edema   N/V/D  Orders are for an extra PO dose of lasix when 111lbs>.  Please advise  CB# [phone]

## 2013-03-30 ENCOUNTER — Telehealth: Payer: Self-pay | Admitting: Physician Assistant

## 2013-03-30 NOTE — Telephone Encounter (Signed)
Not at 111 - no extra lasix. Also - no symptoms to indicate fluid overload.

## 2013-03-30 NOTE — Telephone Encounter (Signed)
The patient's daughter called because her mother has gained some weight and she gave her an extra Lasix 20 mg tablet as instructed yesterday. However, today her weight was up a little bit more. She wanted to know what to do.  Her blood pressure has been adequate. I therefore advised her to go ahead and give 2 additional Lasix tablets today. She had already taked her morning dose of 20 mg and I advised that it was okay to give her an additional 2 tablets totaling 40 mg. I advised that the only other option would be to bring her to the emergency room. Currently, she is not very short of breath at rest and does not feel any our visit is indicated.   I will route this to the office so that she can get a followup appointment and lab work. She will need a basic metabolic panel tomorrow. I believe she has a home health nurse who may be able to draw this if it is ordered.

## 2013-03-31 ENCOUNTER — Other Ambulatory Visit (INDEPENDENT_AMBULATORY_CARE_PROVIDER_SITE_OTHER): Payer: 59

## 2013-03-31 ENCOUNTER — Telehealth: Payer: Self-pay | Admitting: *Deleted

## 2013-03-31 ENCOUNTER — Other Ambulatory Visit: Payer: Self-pay | Admitting: *Deleted

## 2013-03-31 DIAGNOSIS — I5032 Chronic diastolic (congestive) heart failure: Secondary | ICD-10-CM

## 2013-03-31 DIAGNOSIS — Z79899 Other long term (current) drug therapy: Secondary | ICD-10-CM

## 2013-03-31 LAB — BASIC METABOLIC PANEL
BUN: 53 mg/dL — AB (ref 6–23)
CALCIUM: 8.7 mg/dL (ref 8.4–10.5)
CO2: 22 meq/L (ref 19–32)
CREATININE: 2.5 mg/dL — AB (ref 0.4–1.2)
Chloride: 103 mEq/L (ref 96–112)
GFR: 22.66 mL/min — AB (ref >60.00)
GLUCOSE: 91 mg/dL (ref 70–99)
Potassium: 3.5 mEq/L (ref 3.5–5.1)
Sodium: 135 mEq/L (ref 135–145)

## 2013-03-31 NOTE — Telephone Encounter (Signed)
Spoke with daughter - she will bring pt in today [facility]  She will schedule her for a follow up appt while she is here.  Of note- she states she called EMS for her last night because pt was c/o SOB.  EMS "checked her out" and said she didn't need to go to the hospital.

## 2013-03-31 NOTE — Telephone Encounter (Signed)
Phoned Hughes Betterynthia Stephens, RN to relay PCP's recommendations.  States that pt weighed 113 lbs over the weekend and was advised to phone PCP.

## 2013-03-31 NOTE — Telephone Encounter (Signed)
Brooks SailorsJudy Morgan called triage line again at 1600, stating that she thinks patient is retaining fluid---decreased urinary output.  States she took patient to see cardiologist to have labs drawn today.   BUN 53 Creatinine 2.5  Caregiver wanted to make sure PCP was aware and if he needed for any further orders/recommendations.  CB# [phone]

## 2013-03-31 NOTE — Telephone Encounter (Signed)
Melanie Clements phoned to give PCP update on patient status:   Phoned cardiologist yesterday; phoned 911 last hs; and went to heart health clinic today with fluid overload.    If further questions/orders, etc, please advise  CB# [phone]

## 2013-03-31 NOTE — Telephone Encounter (Signed)
Creatinine is elevated - would back off the lasix. For shortness of breath and/or continued fluid retention will need to see cardiologist or cardiology PA or been seen here.

## 2013-03-31 NOTE — Telephone Encounter (Signed)
Per chart review, family communicated with patient's cardiologist.

## 2013-04-01 ENCOUNTER — Emergency Department (HOSPITAL_COMMUNITY): Payer: Medicare Other

## 2013-04-01 ENCOUNTER — Encounter (HOSPITAL_COMMUNITY): Payer: Self-pay | Admitting: Emergency Medicine

## 2013-04-01 ENCOUNTER — Telehealth: Payer: Self-pay | Admitting: Internal Medicine

## 2013-04-01 ENCOUNTER — Inpatient Hospital Stay (HOSPITAL_COMMUNITY)
Admission: EM | Admit: 2013-04-01 | Discharge: 2013-04-06 | DRG: 292 | Disposition: A | Discharge status: Home Health | Payer: Medicare Other | Attending: Family Medicine | Admitting: Family Medicine

## 2013-04-01 DIAGNOSIS — I5082 Biventricular heart failure: Secondary | ICD-10-CM | POA: Diagnosis present

## 2013-04-01 DIAGNOSIS — Z66 Do not resuscitate: Secondary | ICD-10-CM | POA: Diagnosis present

## 2013-04-01 DIAGNOSIS — Z7982 Long term (current) use of aspirin: Secondary | ICD-10-CM

## 2013-04-01 DIAGNOSIS — R1310 Dysphagia, unspecified: Secondary | ICD-10-CM

## 2013-04-01 DIAGNOSIS — R7989 Other specified abnormal findings of blood chemistry: Secondary | ICD-10-CM

## 2013-04-01 DIAGNOSIS — E785 Hyperlipidemia, unspecified: Secondary | ICD-10-CM

## 2013-04-01 DIAGNOSIS — I495 Sick sinus syndrome: Secondary | ICD-10-CM

## 2013-04-01 DIAGNOSIS — J90 Pleural effusion, not elsewhere classified: Secondary | ICD-10-CM

## 2013-04-01 DIAGNOSIS — I10 Essential (primary) hypertension: Secondary | ICD-10-CM

## 2013-04-01 DIAGNOSIS — K5900 Constipation, unspecified: Secondary | ICD-10-CM

## 2013-04-01 DIAGNOSIS — N179 Acute kidney failure, unspecified: Secondary | ICD-10-CM

## 2013-04-01 DIAGNOSIS — Z950 Presence of cardiac pacemaker: Secondary | ICD-10-CM

## 2013-04-01 DIAGNOSIS — Z79899 Other long term (current) drug therapy: Secondary | ICD-10-CM

## 2013-04-01 DIAGNOSIS — D649 Anemia, unspecified: Secondary | ICD-10-CM

## 2013-04-01 DIAGNOSIS — N189 Chronic kidney disease, unspecified: Secondary | ICD-10-CM

## 2013-04-01 DIAGNOSIS — N183 Chronic kidney disease, stage 3 unspecified: Secondary | ICD-10-CM

## 2013-04-01 DIAGNOSIS — I5032 Chronic diastolic (congestive) heart failure: Secondary | ICD-10-CM

## 2013-04-01 DIAGNOSIS — M25669 Stiffness of unspecified knee, not elsewhere classified: Secondary | ICD-10-CM

## 2013-04-01 DIAGNOSIS — I5043 Acute on chronic combined systolic (congestive) and diastolic (congestive) heart failure: Secondary | ICD-10-CM

## 2013-04-01 DIAGNOSIS — R5383 Other fatigue: Secondary | ICD-10-CM

## 2013-04-01 DIAGNOSIS — M810 Age-related osteoporosis without current pathological fracture: Secondary | ICD-10-CM

## 2013-04-01 DIAGNOSIS — H409 Unspecified glaucoma: Secondary | ICD-10-CM

## 2013-04-01 DIAGNOSIS — R1013 Epigastric pain: Secondary | ICD-10-CM

## 2013-04-01 DIAGNOSIS — I5042 Chronic combined systolic (congestive) and diastolic (congestive) heart failure: Secondary | ICD-10-CM

## 2013-04-01 DIAGNOSIS — I493 Ventricular premature depolarization: Secondary | ICD-10-CM

## 2013-04-01 DIAGNOSIS — I509 Heart failure, unspecified: Secondary | ICD-10-CM

## 2013-04-01 DIAGNOSIS — R109 Unspecified abdominal pain: Secondary | ICD-10-CM

## 2013-04-01 DIAGNOSIS — R5381 Other malaise: Secondary | ICD-10-CM

## 2013-04-01 DIAGNOSIS — I129 Hypertensive chronic kidney disease with stage 1 through stage 4 chronic kidney disease, or unspecified chronic kidney disease: Secondary | ICD-10-CM | POA: Diagnosis present

## 2013-04-01 DIAGNOSIS — R509 Fever, unspecified: Secondary | ICD-10-CM

## 2013-04-01 DIAGNOSIS — J984 Other disorders of lung: Secondary | ICD-10-CM

## 2013-04-01 DIAGNOSIS — Z7189 Other specified counseling: Secondary | ICD-10-CM

## 2013-04-01 DIAGNOSIS — I442 Atrioventricular block, complete: Secondary | ICD-10-CM

## 2013-04-01 DIAGNOSIS — K3189 Other diseases of stomach and duodenum: Secondary | ICD-10-CM

## 2013-04-01 DIAGNOSIS — R778 Other specified abnormalities of plasma proteins: Secondary | ICD-10-CM

## 2013-04-01 DIAGNOSIS — I452 Bifascicular block: Secondary | ICD-10-CM

## 2013-04-01 HISTORY — DX: Atrioventricular block, complete: I44.2

## 2013-04-01 LAB — I-STAT TROPONIN, ED: TROPONIN I, POC: 0.35 ng/mL — AB (ref 0.00–0.08)

## 2013-04-01 LAB — TROPONIN I: Troponin I: 0.31 ng/mL (ref <0.30)

## 2013-04-01 LAB — CBC
HCT: 39 % (ref 36.0–46.0)
Hemoglobin: 12.5 g/dL (ref 12.0–15.0)
MCH: 28.2 pg (ref 26.0–34.0)
MCHC: 32.1 g/dL (ref 30.0–36.0)
MCV: 88 fL (ref 78.0–100.0)
Platelets: 165 10*3/uL (ref 150–400)
RBC: 4.43 MIL/uL (ref 3.87–5.11)
RDW: 15.1 % (ref 11.5–15.5)
WBC: 5.7 10*3/uL (ref 4.0–10.5)

## 2013-04-01 LAB — BASIC METABOLIC PANEL
BUN: 57 mg/dL — AB (ref 6–23)
CALCIUM: 9.1 mg/dL (ref 8.4–10.5)
CO2: 22 mEq/L (ref 19–32)
Chloride: 100 mEq/L (ref 96–112)
Creatinine, Ser: 2.3 mg/dL — ABNORMAL HIGH (ref 0.50–1.10)
GFR, EST AFRICAN AMERICAN: 20 mL/min — AB (ref >90)
GFR, EST NON AFRICAN AMERICAN: 17 mL/min — AB (ref >90)
Glucose, Bld: 94 mg/dL (ref 70–99)
POTASSIUM: 3.7 meq/L (ref 3.7–5.3)
SODIUM: 136 meq/L — AB (ref 137–147)

## 2013-04-01 LAB — PRO B NATRIURETIC PEPTIDE: Pro B Natriuretic peptide (BNP): 23418 pg/mL — ABNORMAL HIGH (ref 0–450)

## 2013-04-01 NOTE — ED Notes (Signed)
Dr. Beaton notified of elevated i-stat troponin 

## 2013-04-01 NOTE — ED Provider Notes (Signed)
CSN: [id number]     Arrival date & time 04/01/13  1654 History   First MD Initiated Contact with Patient 04/01/13 1749     Chief Complaint  Patient presents with  . Congestive Heart Failure     (Consider location/radiation/quality/duration/timing/severity/associated sxs/prior Treatment) The history is provided by the patient and medical records. No language interpreter was used.    Melanie Clements is a 80 y.o. female  with a hx of pacemaker (sept 2013), CHF, anemia, HTN presents to the Emergency Department complaining of gradual, persistent, progressively worsening general weakness and SOB onset 1 week.  Pt's daughter reports she takes lasix 20mg  QD and more as needed.  She was given 40mg  Sat and Sun and then she got her regular 20mg  and today she has had no lasix.  Pt daughter reports she had bloodwork at [facility] yesterday which showed a decrease in her kidney function and they were asked to stop the Lasix.  Pt and daughter reports increasing dyspnea on exertion for the last several days and leg swelling extending to her thighs.  Nothing seems to make it better and exertion makes it worse.  Daughter reports that she called the CHF clinic this afternoon with her concerns and they recommended a visit to the ED.  Pt also with decreased PO intake for the last several days.  Her weight yesterday was 113. Pt and daughter denies fever, chills, headache, neck pain, CP, abd pain, N/V/D, syncope, dysuria.       Past Medical History  Diagnosis Date  . Acute upper gastrointestinal hemorrhage     2008  . Stiffness of joint, lower leg   . Constipation   . Weakness   . Dyspepsia   . Pulmonary nodule     right upper lobe  . Anemia   . Osteoporosis   . Glaucoma   . Hypertension   . Hyperlipidemia   . Sinoatrial node dysfunction   . CHF (congestive heart failure)     diastolic and systolic (EF 35-40%)  . Pacemaker-St.Jude-single chamber 10/18/2011   Past Surgical History  Procedure Laterality  Date  . Cataract extraction    . Upper gastrointestinal endoscopy    . Colonoscopy     No family history on file. History  Substance Use Topics  . Smoking status: Never Smoker   . Smokeless tobacco: Never Used  . Alcohol Use: No   OB History   Grav Para Term Preterm Abortions TAB SAB Ect Mult Living                 Review of Systems  Constitutional: Negative for fever, diaphoresis, appetite change, fatigue and unexpected weight change.  HENT: Negative for mouth sores.   Eyes: Negative for visual disturbance.  Respiratory: Positive for shortness of breath. Negative for cough, chest tightness and wheezing.   Cardiovascular: Positive for leg swelling. Negative for chest pain.  Gastrointestinal: Negative for nausea, vomiting, abdominal pain, diarrhea and constipation.  Endocrine: Negative for polydipsia, polyphagia and polyuria.  Genitourinary: Negative for dysuria, urgency, frequency and hematuria.  Musculoskeletal: Negative for back pain and neck stiffness.  Skin: Negative for rash.  Allergic/Immunologic: Negative for immunocompromised state.  Neurological: Negative for syncope, light-headedness and headaches.  Hematological: Does not bruise/bleed easily.  Psychiatric/Behavioral: Negative for sleep disturbance. The patient is not nervous/anxious.       Allergies  Risedronate sodium  Home Medications   Current Outpatient Rx  Name  Route  Sig  Dispense  Refill  . Artificial Tear  Ointment (EYE LUBRICANT) OINT   Ophthalmic   Apply 1 application to eye at bedtime.         Marland Kitchen aspirin EC 81 MG tablet   Oral   Take 81 mg by mouth daily.         . brinzolamide (AZOPT) 1 % ophthalmic suspension   Both Eyes   Place 1 drop into both eyes 3 (three) times daily.         . calcium carbonate (TUMS LASTING EFFECTS) 500 MG chewable tablet   Oral   Chew 1 tablet by mouth 2 (two) times daily as needed. For heartburn         . carvedilol (COREG) 3.125 MG tablet   Oral    Take 3.125 mg by mouth 2 (two) times daily with a meal.         . enalapril (VASOTEC) 10 MG tablet   Oral   Take 10 mg by mouth daily.         . furosemide (LASIX) 20 MG tablet   Oral   Take 20-40 mg by mouth daily. Hold if weight is 104 pounds or less. Take an extra 20 mg if your weight is 111 pounds         . latanoprost (XALATAN) 0.005 % ophthalmic solution   Both Eyes   Place 1 drop into both eyes at bedtime.         Marland Kitchen loratadine (CLARITIN) 10 MG tablet   Oral   Take 10 mg by mouth daily as needed for allergies.          Marland Kitchen loteprednol (LOTEMAX) 0.5 % ophthalmic suspension   Right Eye   Place 1 drop into the right eye 2 (two) times daily.          . mineral oil liquid   Oral   Take 15 mLs by mouth daily as needed for constipation.   180 mL   12   . pantoprazole (PROTONIX) 40 MG tablet   Oral   Take 40 mg by mouth daily.          BP 114/53  Pulse 59  Temp(Src) 97.5 F (36.4 C) (Oral)  Resp 18  Wt 112 lb 7 oz (51.001 kg)  SpO2 99% Physical Exam  Nursing note and vitals reviewed. Constitutional: She is oriented to person, place, and time. She appears well-developed. No distress.  Awake, alert, nontoxic appearance Cachectic appearing  HENT:  Head: Normocephalic and atraumatic.  Mouth/Throat: Oropharynx is clear and moist. No oropharyngeal exudate.  Eyes: Conjunctivae are normal. No scleral icterus.  Neck: Normal range of motion. Neck supple.  Cardiovascular: Normal rate, regular rhythm, normal heart sounds and intact distal pulses.   No murmur heard. Pulmonary/Chest: No accessory muscle usage. Tachypnea noted. No respiratory distress. She has decreased breath sounds (bases). She has no wheezes. She has no rhonchi. She has no rales. She exhibits no tenderness.  Tachypnea  Abdominal: Soft. Bowel sounds are normal. She exhibits no distension and no mass. There is no tenderness. There is no rebound and no guarding.  Musculoskeletal: Normal range of  motion. She exhibits edema.  3+ pitting edema from the forefoot to the upper thighs  Lymphadenopathy:    She has no cervical adenopathy.  Neurological: She is alert and oriented to person, place, and time. She exhibits normal muscle tone. Coordination normal.  Speech is clear and goal oriented Moves extremities without ataxia  Skin: Skin is warm and dry. She is not diaphoretic.  No erythema.  Psychiatric: She has a normal mood and affect.    ED Course  Procedures (including critical care time) Labs Review Labs Reviewed  BASIC METABOLIC PANEL - Abnormal; Notable for the following:    Sodium 136 (*)    BUN 57 (*)    Creatinine, Ser 2.30 (*)    GFR calc non Af Amer 17 (*)    GFR calc Af Amer 20 (*)    All other components within normal limits  PRO B NATRIURETIC PEPTIDE - Abnormal; Notable for the following:    Pro B Natriuretic peptide (BNP) 23418.0 (*)    All other components within normal limits  TROPONIN I - Abnormal; Notable for the following:    Troponin I 0.31 (*)    All other components within normal limits  I-STAT TROPOININ, ED - Abnormal; Notable for the following:    Troponin i, poc 0.35 (*)    All other components within normal limits  CBC   Imaging Review Dg Chest 2 View  04/01/2013   CLINICAL DATA:  Congestive heart failure.  EXAM: CHEST  2 VIEW  COMPARISON:  DG CHEST 2 VIEW dated 02/08/2012; DG CHEST 2 VIEW dated 06/22/2010; CT CHEST W/O CM dated 08/03/2009  FINDINGS: Single lead pacer with tip at right ventricle. Midline trachea. Moderate cardiomegaly with atherosclerosis in the transverse aorta. Left greater than right bilateral pleural effusions. The left-sided pleural effusion is slightly decreased. The right-sided pleural effusion is similar to minimally increased. No pneumothorax. Low lung volumes. Mild pulmonary venous congestion. Increased right and similar to slightly improved left base airspace disease. A nodular density projecting over the right lung base on the  frontal radiograph likely corresponds to a nodule which was evaluated on prior chest CTs and PET.  IMPRESSION: Cardiomegaly with mild pulmonary venous congestion.  Small bilateral pleural effusions with adjacent atelectasis or infection. Recommend radiographic follow-up until clearing.  Probable chronic right lung base nodule. Recommend attention on follow-up. Presuming this is the same nodule, this was evaluated on prior exams, including back to the CT of 08/03/2009.   Electronically Signed   By: Jeronimo Greaves M.D.   On: 04/01/2013 20:31    EKG Interpretation   None      ECG:  Date: 04/01/2013  Rate: 61  Rhythm: premature ventricular contractions (PVC) and ventricular paced rhythm  QRS Axis: left  Intervals: n/a paced rhythm  ST/T Wave abnormalities: indeterminate  Conduction Disutrbances:none  Narrative Interpretation: ventricular paced rhythm with occasional PVCs, unchanged compared to 10/15/12  Old EKG Reviewed: unchanged    MDM   Final diagnoses:  CHF (congestive heart failure)  Elevated troponin  Pleural effusion  Chronic kidney disease (CKD), stage III (moderate)  Pacemaker-St.Jude-single chamber   Burtis Junes presents with increased peripheral edema, DOE and concerning labwork from yesterday with an elevated BUN at 53 and Creatinine at 2.5 without a hx of CKD.  She has not had lasix today.    6:32 PM Pt with positive iStat troponin.  Lab troponin pending, but likely due to the combined effects of her CHF and AKI.  Will closely monitor and consult with Bensimhon and hospitalist.  Pt denies CP, but does have DOE.    9:03 PM Pt remains alert without hypoxia.  Repeat troponin remains slightly elevated at 0.31 and CXR with cardiomegaly in addition to Small bilateral pleural effusions with adjacent atelectasis or infection.  I personally reviewed the imaging tests through PACS system.  I reviewed available ER/hospitalization records through  the EMR.    9:36 PM Discussed  with Cardiology who will evaluate and admit.    The patient was discussed with and seen by Dr. Radford PaxBeaton who agrees with the treatment plan to admit.   Dahlia ClientHannah Kiven Vangilder, PA-C 04/01/13 2138

## 2013-04-01 NOTE — Telephone Encounter (Signed)
New message  Patients daughter is concerned about the swelling in her mother legs. PCP called this morning and told them not to take anymore Lasix. She doesn't know what to do. Please call and advise.  Daugter Brooks SailorsJudy Morgan [phone]

## 2013-04-01 NOTE — ED Notes (Addendum)
Patient in Xray

## 2013-04-01 NOTE — H&P (Signed)
History and Physical  Patient ID: Melanie Clements MRN: [id number], SOB: Feb 12, 1917 80 y.o. Date of Encounter: 04/01/2013, 10:11 PM  Primary Physician: Illene Regulus, MD Primary Cardiologist: Dr. Graciela Husbands  Chief Complaint: DOE and LE swelling  HPI: 80 y.o. female w/ PMHx significant for CHB s/p pacer, CHF with combined systolic and diastolic dysfunction EF of 35-40% who presented to [facility] on 04/01/2013 with complaints of LE swelling and dyspnea on exertion. Her family assists with the details as they are her primary caregiver.  She describes 1 week of symptoms of LE swelling and worsening fatigue with exertion. Reports weight on Saturday was approx 112 lbs, above her threshold to double her lasix of 111 lbs. On Saturday and Sunday, she took lasix 20 bid (separated due to borderline blood pressures in the 90s) and followed up in the clinic yesterday for blood work which showed worsening renal function. Was told to hold lasix today and she now presents due to continued CHF symptoms and renal dysfunction.  Have not missed any doses of medications (ACEI, lasix etc). No chest pain. Some dyspnea. Does not endorse PND or orthopnea. No fevers, chills. Has had some loose stools over the last days but seems to be improving.   Some difficulty chewing food and swallowing. Outpatient swallowing test has been ordered by PCP.    EKG revealed AV dysynchrony with pacing (VVI pacer). CXR with small bilateral effusions, cardiomegaly and congestion. Labs are significant for Cr of 2.5, BNP of 23k, troponin of 0.35.    Past Medical History  Diagnosis Date  . Acute upper gastrointestinal hemorrhage     20 08  . Stiffness of joint, lower leg   . Constipation   . Weakness   . Dyspepsia   . Pulmonary nodule     right upper lobe  . Anemia   . Osteoporosis   . Glaucoma   . Hypertension   . Hyperlipidemia   . Sinoatrial node dysfunction   . CHF (congestive heart failure)     diastolic and systolic  (EF 35-40%)  . Pacemaker-St.Jude-single chamber 10/18/2011     Surgical History:  Past Surgical History  Procedure Laterality Date  . Cataract extraction    . Upper gastrointestinal endoscopy    . Colonoscopy       Home Meds: Prior to Admission medications   Medication Sig Start Date End Date Taking? Authorizing Provider  Artificial Tear Ointment (EYE LUBRICANT) OINT Apply 1 application to eye at bedtime.   Yes Historical Provider, MD  aspirin EC 81 MG tablet Take 81 mg by mouth daily.   Yes Historical Provider, MD  brinzolamide (AZOPT) 1 % ophthalmic suspension Place 1 drop into both eyes 3 (three) times daily.   Yes Historical Provider, MD  calcium carbonate (TUMS LASTING EFFECTS) 500 MG chewable tablet Chew 1 tablet by mouth 2 (two) times daily as needed. For heartburn   Yes Historical Provider, MD  carvedilol (COREG) 3.125 MG tablet Take 3.125 mg by mouth 2 (two) times daily with a meal.   Yes Historical Provider, MD  enalapril (VASOTEC) 10 MG tablet Take 10 mg by mouth daily.   Yes Historical Provider, MD  furosemide (LASIX) 20 MG tablet Take 20-40 mg by mouth daily. Hold if weight is 104 pounds or less. Take an extra 20 mg if your weight is 111 pounds 05/09/12  Yes Amy D Clegg, NP  latanoprost (XALATAN) 0.005 % ophthalmic solution Place 1 drop into both eyes at bedtime.   Yes Historical  Provider, MD  loratadine (CLARITIN) 10 MG tablet Take 10 mg by mouth daily as needed for allergies.    Yes Historical Provider, MD  loteprednol (LOTEMAX) 0.5 % ophthalmic suspension Place 1 drop into the right eye 2 (two) times daily.    Yes Historical Provider, MD  mineral oil liquid Take 15 mLs by mouth daily as needed for constipation. 05/21/12  Yes Georgina QuintAleksei V Plotnikov, MD  pantoprazole (PROTONIX) 40 MG tablet Take 40 mg by mouth daily.   Yes Historical Provider, MD    Allergies:  Allergies  Allergen Reactions  . Risedronate Sodium Diarrhea    History   Social History  . Marital Status:  Widowed    Spouse Name: N/A    Number of Children: N/A  . Years of Education: N/A   Occupational History  . Not on file.   Social History Main Topics  . Smoking status: Never Smoker   . Smokeless tobacco: Never Used  . Alcohol Use: No  . Drug Use: No  . Sexual Activity: No   Other Topics Concern  . Not on file   Social History Narrative   Pt lives alone but has a supportive family and someone is with her most of the time. Both parents and all her siblings are deceased, none with cardiac issues.Marland Kitchen.Marland Kitchen.She has a very supportive family. remains I-ADLs except for driving..  ACP/End of Life - Care: discussed with patinet and dtr: DNR. Completed MOST - DNR, Limited Additional interventions, Antibiotic with thoughtfullness, time trial of IVF and enteral nutrition.     No family history on file.  Review of Systems: General: negative for chills, fever, night sweats or weight changes.  Cardiovascular: see HPI Dermatological: negative for rash Respiratory: negative for cough or wheezing Urologic: negative for hematuria Abdominal: negative for nausea, vomiting, diarrhea, bright red blood per rectum, melena, or hematemesis Neurologic: negative for visual changes, syncope, or dizziness All other systems reviewed and are otherwise negative except as noted above.  Labs:   Lab Results  Component Value Date   WBC 5.7 04/01/2013   HGB 12.5 04/01/2013   HCT 39.0 04/01/2013   MCV 88.0 04/01/2013   PLT 165 04/01/2013    Recent Labs Lab 04/01/13 1741  NA 136*  K 3.7  CL 100  CO2 22  BUN 57*  CREATININE 2.30*  CALCIUM 9.1  GLUCOSE 94    Recent Labs  04/01/13 1952  TROPONINI 0.31*   Lab Results  Component Value Date   CHOL 197 07/27/2009   HDL 52.10 07/27/2009   LDLCALC 111* 07/27/2009   TRIG 172.0* 07/27/2009   No results found for this basename: DDIMER    Radiology/Studies:  Dg Chest 2 View  04/01/2013   CLINICAL DATA:  Congestive heart failure.  EXAM: CHEST  2 VIEW  COMPARISON:   DG CHEST 2 VIEW dated 02/08/2012; DG CHEST 2 VIEW dated 06/22/2010; CT CHEST W/O CM dated 08/03/2009  FINDINGS: Single lead pacer with tip at right ventricle. Midline trachea. Moderate cardiomegaly with atherosclerosis in the transverse aorta. Left greater than right bilateral pleural effusions. The left-sided pleural effusion is slightly decreased. The right-sided pleural effusion is similar to minimally increased. No pneumothorax. Low lung volumes. Mild pulmonary venous congestion. Increased right and similar to slightly improved left base airspace disease. A nodular density projecting over the right lung base on the frontal radiograph likely corresponds to a nodule which was evaluated on prior chest CTs and PET.  IMPRESSION: Cardiomegaly with mild pulmonary venous congestion.  Small bilateral pleural effusions with adjacent atelectasis or infection. Recommend radiographic follow-up until clearing.  Probable chronic right lung base nodule. Recommend attention on follow-up. Presuming this is the same nodule, this was evaluated on prior exams, including back to the CT of 08/03/2009.   Electronically Signed   By: Jeronimo Greaves M.D.   On: 04/01/2013 20:31     EKG: appears to have p waves not associated with paced V rhythm.  Physical Exam: Blood pressure 113/58, pulse 60, temperature 97.5 F (36.4 C), temperature source Oral, resp. rate 19, weight 51.001 kg (112 lb 7 oz), SpO2 95.00%. General: Elderly and frail appearing, in no acute distress. Head: Normocephalic, atraumatic, sclera non-icteric, nares are without discharge Neck: Supple. Negative for carotid bruits. JVD appears to be at 10 cm. Lungs: decreased at bases bilaterally Heart: RRR with S1 S2. No murmurs, rubs, or gallops appreciated. Abdomen: Soft, non-tender, non-distended with normoactive bowel sounds. No rebound/guarding. No obvious abdominal masses. Msk:  Strength and tone appear normal for age. Extremities: +2 edema to thighs. No clubbing or  cyanosis. Distal pedal pulses are 2+ and equal bilaterally. Neuro: Alert and oriented X 3. Moves all extremities spontaneously. Psych:  Responds to questions appropriately with a normal affect.   Problem List: 1. Acute on chronic systolic/diastolic congestive heart failure excerbation, EF 35% 2. Acute on chronic renal insufficiency 3. Paced ventricular rhythm, atrial dysynchrony 4. Troponemia, likely supply demand mismatch given fluid overload. 5. Diarrhea, resolving 6. Chewing/swallowing difficulties, outpt workup pending  ASSESSMENT AND PLAN:  80 y.o. female w/ PMHx significant for CHB s/p pacer, CHF with combined systolic and diastolic dysfunction EF of 35-40% who presented to [facility] on 04/01/2013 with complaints of LE swelling and dyspnea on exertion --> c/w CHF exerbation.  No clear precipitant but all evidence points to fluid overload (exam, BNP, cxray). Oxygenating well and not dyspneic currently. Complicating her situation is her elevated Cr which improved slightly from yesterday in the setting of holding her lasix. Some evidence of RV dysfunction on echo from 2013. Given that she is still fluid overloaded, will gently attempt diuresis with onetime dose of IV lasix and recheck Cr in the morning prior to redosing.  Continue beta blocker. Hold ACEI due to renal dysfunction. U/A pending.  Troponemia is likely due to supply demand mismatch rather than atheroembolic ACS. Continue aspirin but no need to escalate anticoagulation.   Pt is DNR- no heroic measures in the setting of emergency.  Signed, Adolm Joseph, Troy Sine MD 04/01/2013, 10:11 PM

## 2013-04-01 NOTE — Telephone Encounter (Signed)
Vivi Martensalled Judy at [phone], she is really concerned about pt, she states wt is still 112 lb, pt is very weak, increased SOB, she states edema is up to her thighs and her abd is large, she also reports pt has had some diarrhea for about a week now, she is concerned pt needs to be in the hospital, advised pt should go to ER to be admitted as she has quit a bit of fluid on board and cr is elevated, she is agreeable and will take pt

## 2013-04-01 NOTE — Telephone Encounter (Signed)
Returned pt's dtr phone call at [phone].  Pt's dtr concerned about increased edema and not being able to take Lasix because of worsening kidney function. I spoke with Herbert SetaHeather, RN at Dr. Prescott GumBensimhon's and they will address issue. Routing to their office.

## 2013-04-01 NOTE — ED Notes (Signed)
Troponin result notified to MD.

## 2013-04-01 NOTE — ED Notes (Signed)
Xray called for update results.

## 2013-04-01 NOTE — ED Notes (Signed)
Pt with her daughter, who reports stopped her lasix today due to decrease in renal function. Pt reports leg swelling and fluid overload. Also sob. Denies any CP. Pt is alert and oriented. States hasn't been eating much.

## 2013-04-01 NOTE — Telephone Encounter (Signed)
Phoned Brooks SailorsJudy Morgan & relayed PCP's response and recommendation.  Also further advised to continue to f/u with patient's cardiologist. Darel HongJudy stated that she may end up having to take her to the emergency room, b/c patient couldn't get out of bed; partly due to patient's BLE edema.

## 2013-04-02 ENCOUNTER — Encounter (HOSPITAL_COMMUNITY): Payer: Self-pay | Admitting: *Deleted

## 2013-04-02 ENCOUNTER — Ambulatory Visit (HOSPITAL_COMMUNITY): Payer: 59

## 2013-04-02 DIAGNOSIS — I509 Heart failure, unspecified: Secondary | ICD-10-CM

## 2013-04-02 LAB — BASIC METABOLIC PANEL
BUN: 55 mg/dL — ABNORMAL HIGH (ref 6–23)
CALCIUM: 9 mg/dL (ref 8.4–10.5)
CO2: 22 mEq/L (ref 19–32)
CREATININE: 2.19 mg/dL — AB (ref 0.50–1.10)
Chloride: 103 mEq/L (ref 96–112)
GFR calc Af Amer: 21 mL/min — ABNORMAL LOW (ref >90)
GFR calc non Af Amer: 18 mL/min — ABNORMAL LOW (ref >90)
Glucose, Bld: 100 mg/dL — ABNORMAL HIGH (ref 70–99)
Potassium: 3.8 mEq/L (ref 3.7–5.3)
Sodium: 140 mEq/L (ref 137–147)

## 2013-04-02 LAB — CBC
HCT: 39.2 % (ref 36.0–46.0)
Hemoglobin: 12.6 g/dL (ref 12.0–15.0)
MCH: 28.2 pg (ref 26.0–34.0)
MCHC: 32.1 g/dL (ref 30.0–36.0)
MCV: 87.7 fL (ref 78.0–100.0)
PLATELETS: 172 10*3/uL (ref 150–400)
RBC: 4.47 MIL/uL (ref 3.87–5.11)
RDW: 15.1 % (ref 11.5–15.5)
WBC: 5.5 10*3/uL (ref 4.0–10.5)

## 2013-04-02 MED ORDER — SODIUM CHLORIDE 0.9 % IV SOLN: 250.0000 mL | INTRAVENOUS | Status: DC | PRN

## 2013-04-02 MED ORDER — CARVEDILOL 3.125 MG PO TABS
3.1250 mg | ORAL_TABLET | Freq: Two times a day (BID) | ORAL | Status: DC
  Administered 2013-04-02 – 2013-04-06 (×9): 3.125 mg via ORAL
  Filled 2013-04-02 (×11): qty 1

## 2013-04-02 MED ORDER — SODIUM CHLORIDE 0.9 % IJ SOLN: 3.0000 mL | INTRAMUSCULAR | Status: DC | PRN

## 2013-04-02 MED ORDER — FUROSEMIDE 10 MG/ML IJ SOLN
20.0000 mg | Freq: Once | INTRAMUSCULAR | Status: AC
  Administered 2013-04-02: 20 mg via INTRAVENOUS
  Filled 2013-04-02: qty 2

## 2013-04-02 MED ORDER — FUROSEMIDE 10 MG/ML IJ SOLN
40.0000 mg | Freq: Once | INTRAMUSCULAR | Status: AC
  Administered 2013-04-02: 40 mg via INTRAVENOUS
  Filled 2013-04-02: qty 4

## 2013-04-02 MED ORDER — ASPIRIN EC 81 MG PO TBEC
81.0000 mg | DELAYED_RELEASE_TABLET | Freq: Every day | ORAL | Status: DC
  Administered 2013-04-02 – 2013-04-06 (×4): 81 mg via ORAL
  Filled 2013-04-02 (×5): qty 1

## 2013-04-02 MED ORDER — SODIUM CHLORIDE 0.9 % IJ SOLN
3.0000 mL | Freq: Two times a day (BID) | INTRAMUSCULAR | Status: DC
  Administered 2013-04-02 – 2013-04-06 (×9): 3 mL via INTRAVENOUS

## 2013-04-02 MED ORDER — LATANOPROST 0.005 % OP SOLN
1.0000 [drp] | Freq: Every day | OPHTHALMIC | Status: DC
  Administered 2013-04-02 – 2013-04-05 (×5): 1 [drp] via OPHTHALMIC
  Filled 2013-04-02: qty 2.5

## 2013-04-02 MED ORDER — LOTEPREDNOL ETABONATE 0.5 % OP SUSP
1.0000 [drp] | Freq: Two times a day (BID) | OPHTHALMIC | Status: DC
  Administered 2013-04-02 – 2013-04-06 (×9): 1 [drp] via OPHTHALMIC
  Filled 2013-04-02: qty 5

## 2013-04-02 MED ORDER — BRINZOLAMIDE 1 % OP SUSP
1.0000 [drp] | Freq: Three times a day (TID) | OPHTHALMIC | Status: DC
  Administered 2013-04-02 – 2013-04-06 (×12): 1 [drp] via OPHTHALMIC
  Filled 2013-04-02: qty 10

## 2013-04-02 MED ORDER — HEPARIN SODIUM (PORCINE) 5000 UNIT/ML IJ SOLN
5000.0000 [IU] | Freq: Two times a day (BID) | INTRAMUSCULAR | Status: DC
  Administered 2013-04-02 – 2013-04-06 (×8): 5000 [IU] via SUBCUTANEOUS
  Filled 2013-04-02 (×10): qty 1

## 2013-04-02 MED ORDER — PANTOPRAZOLE SODIUM 40 MG PO TBEC
40.0000 mg | DELAYED_RELEASE_TABLET | Freq: Every day | ORAL | Status: DC
  Administered 2013-04-02 – 2013-04-06 (×4): 40 mg via ORAL
  Filled 2013-04-02 (×4): qty 1

## 2013-04-02 NOTE — Progress Notes (Signed)
I cosign with Laura Caldwell Student Rn on all assessments, medication administration, notes and documentation for this shift. Makenzy Krist A, RN  

## 2013-04-02 NOTE — Progress Notes (Signed)
Pt had 5 beats of v tach nonsustained.  Pt BP 108/60, pulse 60.  Pt asymptomatic.  MD notified.  Will continue to monitor.

## 2013-04-02 NOTE — Progress Notes (Signed)
Pt with 6 beats wide QRS, pt asymptomatic and sleeping. Will continue to monitor. Huel Coventryosenberger, Laural Eiland A, RN

## 2013-04-02 NOTE — Progress Notes (Signed)
UR completed 

## 2013-04-02 NOTE — Progress Notes (Signed)
Patient Name: Melanie Clements      SUBJECTIVE: Admitted with edema and sob with marked change in renal function since seen in 2/4  55/2.2<< 38/1.6  BNP>23K C/o legs tight today breathing better Hypotension has been problem in the past  Echo 1/14 EF 40%   Hx of VVI pacing with intact sinus rhthym Implanted 9/13     Past Medical History  Diagnosis Date  . Acute upper gastrointestinal hemorrhage     2008  . Stiffness of joint, lower leg   . Constipation   . Weakness   . Dyspepsia   . Pulmonary nodule     right upper lobe  . Anemia   . Osteoporosis   . Glaucoma   . Hypertension   . Hyperlipidemia   . Sinoatrial node dysfunction   . CHF (congestive heart failure)     diastolic and systolic (EF 35-40%)  . Pacemaker-St.Jude-single chamber 10/18/2011    Scheduled Meds:  Scheduled Meds: . aspirin EC  81 mg Oral Daily  . brinzolamide  1 drop Both Eyes TID  . carvedilol  3.125 mg Oral BID WC  . heparin  5,000 Units Subcutaneous BID  . latanoprost  1 drop Both Eyes QHS  . loteprednol  1 drop Right Eye BID  . pantoprazole  40 mg Oral Daily  . sodium chloride  3 mL Intravenous Q12H   Continuous Infusions:   PHYSICAL EXAM Filed Vitals:   04/02/13 0050 04/02/13 0142 04/02/13 0520 04/02/13 0614  BP: 130/54 108/60 111/72   Pulse: 120 60 69 60  Temp: 97.4 F (36.3 C)  97.9 F (36.6 C)   TempSrc: Oral  Oral   Resp: 18  18   Weight:   109 lb 12.6 oz (49.8 kg)   SpO2: 99%  98%    Well developed and nourished in no acute distress HENT normal Neck supple  Clear Regular rate and rhythm, 2/6 murmur  Abd-soft with active BS No Clubbing cyanosis 2+edema Skin-warm and dry A & Oriented  Grossly normal sensory and motor function   TELEMETRY: Reviewed telemetry pt in  vpacing    Intake/Output Summary (Last 24 hours) at 04/02/13 16100822 Last data filed at 04/02/13 0521  Gross per 24 hour  Intake      3 ml  Output    625 ml  Net   -622 ml    LABS: Basic  Metabolic Panel:  Recent Labs Lab 03/31/13 1311 04/01/13 1741 04/02/13 0412  NA 135 136* 140  K 3.5 3.7 3.8  CL 103 100 103  CO2 22 22 22   GLUCOSE 91 94 100*  BUN 53* 57* 55*  CREATININE 2.5* 2.30* 2.19*  CALCIUM 8.7 9.1 9.0   Cardiac Enzymes:  Recent Labs  04/01/13 1952  TROPONINI 0.31*   CBC:  Recent Labs Lab 04/01/13 1741 04/02/13 0412  WBC 5.7 5.5  HGB 12.5 12.6  HCT 39.0 39.2  MCV 88.0 87.7  PLT 165 172   PROTIME: No results found for this basename: LABPROT, INR,  in the last 72 hours Liver Function Tests: No results found for this basename: AST, ALT, ALKPHOS, BILITOT, PROT, ALBUMIN,  in the last 72 hours No results found for this basename: LIPASE, AMYLASE,  in the last 72 hours BNP: BNP (last 3 results)  Recent Labs  04/01/13 1741  PROBNP 23418.0*     Device Interrogation: pending   ASSESSMENT AND PLAN:  Active Problems:   Chronic kidney disease (CKD),  stage III (moderate)   Biventricular CHF (congestive heart failure) A/C renal injury now grd 4 Will try and increase pacing rate to improve cardiac output and renal perfusion  willgive one dose of lasix today as Cr coming down   Signed, Sherryl Manges MD  04/02/2013

## 2013-04-03 ENCOUNTER — Observation Stay (HOSPITAL_COMMUNITY): Payer: Medicare Other

## 2013-04-03 DIAGNOSIS — R1310 Dysphagia, unspecified: Secondary | ICD-10-CM

## 2013-04-03 DIAGNOSIS — I5043 Acute on chronic combined systolic (congestive) and diastolic (congestive) heart failure: Principal | ICD-10-CM

## 2013-04-03 DIAGNOSIS — Z950 Presence of cardiac pacemaker: Secondary | ICD-10-CM

## 2013-04-03 DIAGNOSIS — N179 Acute kidney failure, unspecified: Secondary | ICD-10-CM

## 2013-04-03 DIAGNOSIS — I442 Atrioventricular block, complete: Secondary | ICD-10-CM

## 2013-04-03 DIAGNOSIS — N189 Chronic kidney disease, unspecified: Secondary | ICD-10-CM

## 2013-04-03 LAB — BASIC METABOLIC PANEL
BUN: 50 mg/dL — ABNORMAL HIGH (ref 6–23)
CALCIUM: 9.2 mg/dL (ref 8.4–10.5)
CHLORIDE: 106 meq/L (ref 96–112)
CO2: 25 mEq/L (ref 19–32)
CREATININE: 1.78 mg/dL — AB (ref 0.50–1.10)
GFR calc non Af Amer: 23 mL/min — ABNORMAL LOW (ref >90)
GFR, EST AFRICAN AMERICAN: 27 mL/min — AB (ref >90)
Glucose, Bld: 107 mg/dL — ABNORMAL HIGH (ref 70–99)
Potassium: 3.7 mEq/L (ref 3.7–5.3)
Sodium: 144 mEq/L (ref 137–147)

## 2013-04-03 MED ORDER — FUROSEMIDE 10 MG/ML IJ SOLN
40.0000 mg | Freq: Once | INTRAMUSCULAR | Status: AC
  Administered 2013-04-03: 40 mg via INTRAVENOUS
  Filled 2013-04-03: qty 4

## 2013-04-03 NOTE — Progress Notes (Signed)
Pharmacist Heart Failure Core Measure Documentation  Assessment: Melanie Clements has an EF documented as 35-40%  on 02/2012 by ECHO.  Rationale: Heart failure patients with left ventricular systolic dysfunction (LVSD) and an EF < 40% should be prescribed an angiotensin converting enzyme inhibitor (ACEI) or angiotensin receptor blocker (ARB) at discharge unless a contraindication is documented in the medical record.  This patient is not currently on an ACEI or ARB for HF.  This note is being placed in the record in order to provide documentation that a contraindication to the use of these agents is present for this encounter.  ACE Inhibitor or Angiotensin Receptor Blocker is contraindicated (specify all that apply)  []   ACEI allergy AND ARB allergy []   Angioedema []   Moderate or severe aortic stenosis []   Hyperkalemia []   Hypotension []   Renal artery stenosis [x]   Worsening renal function, preexisting renal disease or dysfunction   Melanie Clements, Melanie Clements 04/03/2013 1:46 PM

## 2013-04-03 NOTE — Progress Notes (Signed)
SUBJECTIVE: The patient is doing well today.  She is hungry and wants to eat.  (NPO for barium study due to difficulty swallowing pills yesterda).  At this time, she denies chest pain, shortness of breath, or any new concerns.  Marland Kitchen. aspirin EC  81 mg Oral Daily  . brinzolamide  1 drop Both Eyes TID  . carvedilol  3.125 mg Oral BID WC  . furosemide  40 mg Intravenous Once  . heparin  5,000 Units Subcutaneous BID  . latanoprost  1 drop Both Eyes QHS  . loteprednol  1 drop Right Eye BID  . pantoprazole  40 mg Oral Daily  . sodium chloride  3 mL Intravenous Q12H      OBJECTIVE: Physical Exam: Filed Vitals:   04/02/13 2324 04/03/13 0243 04/03/13 0650 04/03/13 1038  BP: 121/67 122/68 128/76 129/69  Pulse: 79 80 81 80  Temp: 97.8 F (36.6 C) 97.5 F (36.4 C) 98 F (36.7 C) 97.5 F (36.4 C)  TempSrc: Oral Oral Oral Oral  Resp: 20 18 18 17   Height:      Weight:   106 lb 4.2 oz (48.2 kg)   SpO2: 95% 100% 100% 96%    Intake/Output Summary (Last 24 hours) at 04/03/13 1117 Last data filed at 04/03/13 0900  Gross per 24 hour  Intake    360 ml  Output   2650 ml  Net  -2290 ml    Telemetry reveals sinus rhythm with V pacing at 80 bpm (VA dissociated)  GEN- The patient is elderly and frail appearing, alert   Head- normocephalic, atraumatic Eyes-  Sclera clear, conjunctiva pink Ears- hearing intact Oropharynx- clear Neck- supple, + JVD Lungs- decreased BS at the bases, normal work of breathing Heart- Regular rate and rhythm (paced) GI- soft, NT, ND, + BS Extremities- no clubbing, cyanosis, +2 edema Skin- no rash or lesion Psych- euthymic mood, full affect Neuro- strength and sensation are intact  LABS: Basic Metabolic Panel:  Recent Labs  16/11/9600/25/15 0412 04/03/13 0429  NA 140 144  K 3.8 3.7  CL 103 106  CO2 22 25  GLUCOSE 100* 107*  BUN 55* 50*  CREATININE 2.19* 1.78*  CALCIUM 9.0 9.2   Liver Function Tests: No results found for this basename: AST, ALT, ALKPHOS,  BILITOT, PROT, ALBUMIN,  in the last 72 hours No results found for this basename: LIPASE, AMYLASE,  in the last 72 hours CBC:  Recent Labs  04/01/13 1741 04/02/13 0412  WBC 5.7 5.5  HGB 12.5 12.6  HCT 39.0 39.2  MCV 88.0 87.7  PLT 165 172   Cardiac Enzymes:  Recent Labs  04/01/13 1952  TROPONINI 0.31*    RADIOLOGY: Dg Chest 2 View  04/01/2013   CLINICAL DATA:  Congestive heart failure.  EXAM: CHEST  2 VIEW  COMPARISON:  DG CHEST 2 VIEW dated 02/08/2012; DG CHEST 2 VIEW dated 06/22/2010; CT CHEST W/O CM dated 08/03/2009  FINDINGS: Single lead pacer with tip at right ventricle. Midline trachea. Moderate cardiomegaly with atherosclerosis in the transverse aorta. Left greater than right bilateral pleural effusions. The left-sided pleural effusion is slightly decreased. The right-sided pleural effusion is similar to minimally increased. No pneumothorax. Low lung volumes. Mild pulmonary venous congestion. Increased right and similar to slightly improved left base airspace disease. A nodular density projecting over the right lung base on the frontal radiograph likely corresponds to a nodule which was evaluated on prior chest CTs and PET.  IMPRESSION: Cardiomegaly with mild pulmonary  venous congestion.  Small bilateral pleural effusions with adjacent atelectasis or infection. Recommend radiographic follow-up until clearing.  Probable chronic right lung base nodule. Recommend attention on follow-up. Presuming this is the same nodule, this was evaluated on prior exams, including back to the CT of 08/03/2009.   Electronically Signed   By: Jeronimo Greaves M.D.   On: 04/01/2013 20:31   Dg Esophagus  04/03/2013   CLINICAL DATA:  Dysphagia.  Difficulty swallowing.  EXAM: ESOPHOGRAM/BARIUM SWALLOW  TECHNIQUE: Single contrast examination was performed using thin and thick barium and 13 mm barium tablet.  COMPARISON:  None.  FLUOROSCOPY TIME:  2 min 8 seconds  FINDINGS: The patient has a poor secondary stripping  wave and occasional tertiary contractions in the esophagus. There is no stricture or mass or hiatal hernia. A 13 mm barium tablet passed from the mouth to the stomach without significant delay. The patient did have prolonged oral stage difficulty swallowing the tablet.  IMPRESSION: 1. Diffuse esophageal motility disorder with a poor secondary stripping waves and occasional tertiary contractions. 2. Oral stage difficulty swallowing the tablet. 3. Otherwise normal.   Electronically Signed   By: Geanie Cooley M.D.   On: 04/03/2013 10:24    ASSESSMENT AND PLAN:  Active Problems:   Chronic kidney disease (CKD), stage III (moderate)   Biventricular CHF (congestive heart failure)  1. Acute on chronic combined systolic/ diastolic dysfunction Much improved with diuresis Continue current medical therapy Strict Is/Os, daily weights  2. Complete heart block Single chamber pacing on tele at 80 bpm  3. Acute on chronic renal failure Improved with duiresis  4. Dysphagia GI study noted Will ask for speech path to see  Prognosis long term is poor DNR is appropriate  Possible conversion to oral lasix tomorrow and discharge with close outpatient follow-up  Will ask PT to see today    Hillis Range, MD 04/03/2013 11:17 AM

## 2013-04-03 NOTE — Progress Notes (Signed)
Pt refused to take medications, barrium swallow results pending, still NPO. MD on call notified.

## 2013-04-04 DIAGNOSIS — I509 Heart failure, unspecified: Secondary | ICD-10-CM

## 2013-04-04 LAB — BASIC METABOLIC PANEL
BUN: 43 mg/dL — ABNORMAL HIGH (ref 6–23)
CALCIUM: 9.2 mg/dL (ref 8.4–10.5)
CO2: 27 mEq/L (ref 19–32)
Chloride: 104 mEq/L (ref 96–112)
Creatinine, Ser: 1.64 mg/dL — ABNORMAL HIGH (ref 0.50–1.10)
GFR calc non Af Amer: 26 mL/min — ABNORMAL LOW (ref >90)
GFR, EST AFRICAN AMERICAN: 30 mL/min — AB (ref >90)
Glucose, Bld: 98 mg/dL (ref 70–99)
Potassium: 3.8 mEq/L (ref 3.7–5.3)
SODIUM: 144 meq/L (ref 137–147)

## 2013-04-04 MED ORDER — FUROSEMIDE 10 MG/ML IJ SOLN
40.0000 mg | Freq: Two times a day (BID) | INTRAMUSCULAR | Status: AC
  Administered 2013-04-04 (×2): 40 mg via INTRAVENOUS
  Filled 2013-04-04: qty 4

## 2013-04-04 MED ORDER — BIOTENE DRY MOUTH MT LIQD
15.0000 mL | Freq: Two times a day (BID) | OROMUCOSAL | Status: DC
  Administered 2013-04-04 – 2013-04-06 (×4): 15 mL via OROMUCOSAL

## 2013-04-04 NOTE — Evaluation (Signed)
Clinical/Bedside Swallow Evaluation Patient Details  Name: Melanie Clements MRN: [id number] Date of Birth: [birth year]  Today's Date: 04/04/2013 Time: 9563-8756 SLP Time Calculation (min): 24 min  Past Medical History:  Past Medical History  Diagnosis Date  . Acute upper gastrointestinal hemorrhage     2008  . Stiffness of joint, lower leg   . Constipation   . Weakness   . Dyspepsia   . Pulmonary nodule     right upper lobe  . Anemia   . Osteoporosis   . Glaucoma   . Hypertension   . Hyperlipidemia   . Atrioventricular block, complete   . CHF (congestive heart failure)     diastolic and systolic (EF 35-40%)  . Pacemaker-St.Jude-single chamber 10/18/2011   Past Surgical History:  Past Surgical History  Procedure Laterality Date  . Cataract extraction    . Upper gastrointestinal endoscopy    . Colonoscopy     HPI:  80 y.o. female w/ PMHx significant for CHB s/p pacer, CHF with combined systolic and diastolic dysfunction EF of 35-40% who presented to [facility] on 04/01/2013 with complaints of LE swelling and dyspnea on exertion. Some difficulty chewing food and swallowing per history. Pt had esophagram on 04/03/13 that reported Diffuse esophageal motility disorder with a poor secondary stripping waves and occasional tertiary contractions. 2. Oral stage difficulty swallowing the tablet. 3. Otherwise normal. SLP was subsequently ordered.    Assessment / Plan / Recommendation Clinical Impression  Pt denies any difficulty swallowing pills and RN also reports pt swallows pills well. There is some mild oral dysphagia with oral residuals in left anterior slci, pt responded well to cues for lingual sweep. Also advised her to complete rinse of dentures and mouth post meal. No focal weakness observed. 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 This is a placement issue, not a weakness issue and has likely been present since  childhood. Pt did not seem aware of it. Education complete, discussed with Charity fundraiser. No SLP f/u needed.     Aspiration Risk  Mild    Diet Recommendation Regular;Thin liquid   Liquid Administration via: Cup;Straw Medication Administration: Whole meds with liquid Supervision: Patient able to self feed Compensations: Check for pocketing    Other  Recommendations Oral Care Recommendations:  (oral care after meals)   Follow Up Recommendations  None    Frequency and Duration        Pertinent Vitals/Pain NA    SLP Swallow Goals     Swallow Study Prior Functional Status  Type of Home: House Available Help at Discharge: Family;Available 24 hours/day    General HPI: 80 y.o. female w/ PMHx significant for CHB s/p pacer, CHF with combined systolic and diastolic dysfunction EF of 35-40% who presented to [facility] on 04/01/2013 with complaints of LE swelling and dyspnea on exertion. Some difficulty chewing food and swallowing per history. Pt had esophagram on 04/03/13 that reported Diffuse esophageal motility disorder with a poor secondary stripping waves and occasional tertiary contractions. 2. Oral stage difficulty swallowing the tablet. 3. Otherwise normal. SLP was subsequently ordered.  Type of Study: Bedside swallow evaluation Previous Swallow Assessment: Barium swallow see HPI Diet Prior to this Study: Regular;Thin liquids Temperature Spikes Noted: No Respiratory Status: Room air History of Recent Intubation: No Behavior/Cognition: Alert;Cooperative;Pleasant mood Oral Cavity - Dentition: Dentures, top;Dentures, bottom Self-Feeding Abilities: Able to feed self Patient Positioning:  (sitting edge of bed) Baseline Vocal Quality: Clear  Volitional Cough: Strong Volitional Swallow: Able to elicit    Oral/Motor/Sensory Function Overall Oral Motor/Sensory Function: Appears within functional limits for tasks assessed Velum:  (backs alveolar fricatives, pt not aware. ) Mandible:  Within Functional Limits   Ice Chips     Thin Liquid Thin Liquid: Within functional limits Presentation: Cup;Straw;Self Fed    Nectar Thick Nectar Thick Liquid: Not tested   Honey Thick Honey Thick Liquid: Not tested   Puree Puree: Within functional limits   Solid   GO Functional Assessment Tool Used: clinical judgement Functional Limitations: Swallowing Swallow Current Status ([id number](G8996): At least 1 percent but less than 20 percent impaired, limited or restricted Swallow Goal Status [phone](G8997): At least 1 percent but less than 20 percent impaired, limited or restricted Swallow Discharge Status [phone](G8998): At least 1 percent but less than 20 percent impaired, limited or restricted  Solid: Impaired Presentation: Self Fed Oral Phase Impairments: Impaired mastication Oral Phase Functional Implications: Left lateral sulci pocketing      Harlon DittyBonnie Willa Brocks, MA CCC-SLP [phone]  Claudine MoutonDeBlois, [name] 04/04/2013,3:16 PM

## 2013-04-04 NOTE — Progress Notes (Signed)
I spoke briefly with [name] regarding her HF.  She lives with her children/grandchildren.  She reports that she does not weigh daily and does not eat sodium.  She seemed to be a bit confused at times during our conversation.  I did review what information I could concerning heart failure.  She says that she is aware of signs and symptoms of heart failure.  [facility] if not willing to consider SNF placement.  Driscilla Moatsaphne Toluwani Yadav RN, BSN, PCCN--Heart Failure Statisticianurse Navigator

## 2013-04-04 NOTE — Evaluation (Addendum)
Physical Therapy Evaluation Patient Details Name: Melanie Clements MRN: [id number] DOB: 1917-03-11 Today's Date: 04/04/2013 Time: 1209-1233 PT Time Calculation (min): 24 min  PT Assessment / Plan / Recommendation History of Present Illness  80 y.o. female w/ PMHx significant for CHB s/p pacer, CHF with combined systolic and diastolic dysfunction EF of 35-40% who presented to [facility] on 04/01/2013 with complaints of LE swelling and dyspnea on exertion. Her family assists with the details as they are her primary caregiver.  Clinical Impression  Pt very pleasant and states family is available with her 24hrs/day. Pt able to complete pericare with supervision but required assist for transfers which she states is not baseline. Pt with below deficits and will benefit from acute therapy to maximize mobility, function and safety to decrease burden of care on return home with family. Recommend daily ambulation with nursing.     PT Assessment  Patient needs continued PT services    Follow Up Recommendations  No PT follow up;Supervision/Assistance - 24 hour    Does the patient have the potential to tolerate intense rehabilitation      Barriers to Discharge        Equipment Recommendations  3in1 (PT)    Recommendations for Other Services     Frequency Min 3X/week    Precautions / Restrictions Precautions Precautions: Fall Precaution Comments: pt reports never falling   Pertinent Vitals/Pain sats 94% on RA with activity, HR 80 No pain     Mobility  Bed Mobility Overal bed mobility: Needs Assistance Bed Mobility: Supine to Sit Supine to sit: Min assist General bed mobility comments: min assist to elevate trunk from flat bed, increased time and cueing for sequence Transfers Overall transfer level: Needs assistance Transfers: Sit to/from Stand Sit to Stand: Min assist;Mod assist General transfer comment: pt min assist to stand from bed, mod assist from low toilet with rail.  Cueing throughout for hand placement, sequence and safety Ambulation/Gait Ambulation/Gait assistance: Min guard Ambulation Distance (Feet): 45 Feet (15' then 45') Assistive device: Rolling walker (2 wheeled) Gait Pattern/deviations: Step-through pattern;Trunk flexed;Decreased stride length Gait velocity interpretation: <1.8 ft/sec, indicative of risk for recurrent falls General Gait Details: pt with slow shuffle gait with cues to step into RW, extend trunk and directional cues    Exercises     PT Diagnosis: Difficulty walking;Generalized weakness  PT Problem List: Decreased activity tolerance;Decreased mobility;Decreased knowledge of use of DME PT Treatment Interventions: Gait training;Stair training;Functional mobility training;DME instruction;Patient/family education;Therapeutic activities;Therapeutic exercise     PT Goals(Current goals can be found in the care plan section) Acute Rehab PT Goals Patient Stated Goal: return home PT Goal Formulation: With patient Time For Goal Achievement: 04/11/13 Potential to Achieve Goals: Good  Visit Information  Last PT Received On: 04/04/13 Assistance Needed: +1 History of Present Illness: 80 y.o. female w/ PMHx significant for CHB s/p pacer, CHF with combined systolic and diastolic dysfunction EF of 35-40% who presented to [facility] on 04/01/2013 with complaints of LE swelling and dyspnea on exertion. Her family assists with the details as they are her primary caregiver.       Prior Functioning  Home Living Family/patient expects to be discharged to:: Private residence Living Arrangements: Children Available Help at Discharge: Family;Available 24 hours/day Type of Home: House Home Access: Stairs to enter [employer] of Steps: 4 Entrance Stairs-Rails: Can reach both Home Layout: One level Home Equipment: Cane - single point;Walker - 2 wheels;Shower seat;Grab bars - tub/shower Prior Function Level  of Independence:  Needs assistance Gait / Transfers Assistance Needed: pt states she uses a cane most of the time ADL's / Homemaking Assistance Needed: family assists with bathing and dressing and they do all cooking and housework Communication Communication: HOH    Cognition  Cognition Arousal/Alertness: Awake/alert Behavior During Therapy: WFL for tasks assessed/performed Overall Cognitive Status: No family/caregiver present to determine baseline cognitive functioning Memory: Decreased short-term memory    Extremity/Trunk Assessment Upper Extremity Assessment Upper Extremity Assessment: Generalized weakness Lower Extremity Assessment Lower Extremity Assessment: Generalized weakness Cervical / Trunk Assessment Cervical / Trunk Assessment: Kyphotic   Balance Balance Overall balance assessment: Needs assistance Sitting balance-Leahy Scale: Good Standing balance support: Single extremity supported;Bilateral upper extremity supported;During functional activity Standing balance-Leahy Scale: Poor  End of Session PT - End of Session Equipment Utilized During Treatment: Gait belt Activity Tolerance: Patient tolerated treatment well Patient left: in chair;with call bell/phone within reach Nurse Communication: Mobility status  GP Functional Assessment Tool Used: clinical judgement Functional Limitation: Mobility: Walking and moving around Mobility: Walking and Moving Around Current Status (Q6578(G8978): At least 20 percent but less than 40 percent impaired, limited or restricted Mobility: Walking and Moving Around Goal Status [phone]([phone]): At least 1 percent but less than 20 percent impaired, limited or restricted   Melanie Clements, Melanie Clements Melanie Clements 04/04/2013, 12:45 PM Melanie MeigsMaija Clements Melanie Clements, PT [phone]

## 2013-04-04 NOTE — Progress Notes (Signed)
ELECTROPHYSIOLOGY ROUNDING NOTE    Patient Name: Melanie Clements Date of Encounter: 04/04/2013    SUBJECTIVE:Patient states she is improved today.  Pending PT and speech evaluation - was ordered yesterday.  Weight down 10 pounds since admission.  I/O -5L  EF 40% 1/14  TELEMETRY: Reviewed telemetry pt in V pacing with AV dissociation Filed Vitals:   04/03/13 1038 04/03/13 1300 04/03/13 2110 04/04/13 0649  BP: 129/69 130/70 131/57 110/57  Pulse: 80 82 80 80  Temp: 97.5 F (36.4 C) 97.1 F (36.2 C) 98.1 F (36.7 C) 98.5 F (36.9 C)  TempSrc: Oral Oral Oral Oral  Resp: 17 18 18 18   Height:      Weight:    102 lb 15.3 oz (46.7 kg)  SpO2: 96% 96% 95% 95%    Intake/Output Summary (Last 24 hours) at 04/04/13 0723 Last data filed at 04/04/13 0717  Gross per 24 hour  Intake    600 ml  Output   3025 ml  Net  -2425 ml    CURRENT MEDICATIONS: . aspirin EC  81 mg Oral Daily  . brinzolamide  1 drop Both Eyes TID  . carvedilol  3.125 mg Oral BID WC  . heparin  5,000 Units Subcutaneous BID  . latanoprost  1 drop Both Eyes QHS  . loteprednol  1 drop Right Eye BID  . pantoprazole  40 mg Oral Daily  . sodium chloride  3 mL Intravenous Q12H    LABS: Basic Metabolic Panel:  Recent Labs  30/86/5702/26/15 0429 04/04/13 0303  NA 144 144  K 3.7 3.8  CL 106 104  CO2 25 27  GLUCOSE 107* 98  BUN 50* 43*  CREATININE 1.78* 1.64*  CALCIUM 9.2 9.2   CBC:  Recent Labs  04/01/13 1741 04/02/13 0412  WBC 5.7 5.5  HGB 12.5 12.6  HCT 39.0 39.2  MCV 88.0 87.7  PLT 165 172   Cardiac Enzymes:  Recent Labs  04/01/13 1952  TROPONINI 0.31*    Radiology/Studies:  Dg Chest 2 View 04/01/2013   CLINICAL DATA:  Congestive heart failure.  EXAM: CHEST  2 VIEW  COMPARISON:  DG CHEST 2 VIEW dated 02/08/2012; DG CHEST 2 VIEW dated 06/22/2010; CT CHEST W/O CM dated 08/03/2009  FINDINGS: Single lead pacer with tip at right ventricle. Midline trachea. Moderate cardiomegaly with atherosclerosis in  the transverse aorta. Left greater than right bilateral pleural effusions. The left-sided pleural effusion is slightly decreased. The right-sided pleural effusion is similar to minimally increased. No pneumothorax. Low lung volumes. Mild pulmonary venous congestion. Increased right and similar to slightly improved left base airspace disease. A nodular density projecting over the right lung base on the frontal radiograph likely corresponds to a nodule which was evaluated on prior chest CTs and PET.  IMPRESSION: Cardiomegaly with mild pulmonary venous congestion.  Small bilateral pleural effusions with adjacent atelectasis or infection. Recommend radiographic follow-up until clearing.  Probable chronic right lung base nodule. Recommend attention on follow-up. Presuming this is the same nodule, this was evaluated on prior exams, including back to the CT of 08/03/2009.   Electronically Signed   By: Jeronimo GreavesKyle  Talbot M.D.   On: 04/01/2013 20:31   Dg Esophagus 04/03/2013   CLINICAL DATA:  Dysphagia.  Difficulty swallowing.  EXAM: ESOPHOGRAM/BARIUM SWALLOW  TECHNIQUE: Single contrast examination was performed using thin and thick barium and 13 mm barium tablet.  COMPARISON:  None.  FLUOROSCOPY TIME:  2 min 8 seconds  FINDINGS: The patient has a  poor secondary stripping wave and occasional tertiary contractions in the esophagus. There is no stricture or mass or hiatal hernia. A 13 mm barium tablet passed from the mouth to the stomach without significant delay. The patient did have prolonged oral stage difficulty swallowing the tablet.  IMPRESSION: 1. Diffuse esophageal motility disorder with a poor secondary stripping waves and occasional tertiary contractions. 2. Oral stage difficulty swallowing the tablet. 3. Otherwise normal.   Electronically Signed   By: Geanie Cooley M.D.   On: 04/03/2013 10:24    PHYSICAL EXAM   Well developed and nourished in no acute distress HENT normal Neck supple with JVP-8-10 Decreased BS  R  base Regular rate and rhythm, no murmurs or gallops Abd-soft with active BS No Clubbing cyanosis 1+ edema Skin-warm and dry A & Oriented  Grossly normal sensory and motor function   Active Problems:   Chronic kidney disease (CKD), stage III (moderate)   Biventricular CHF (congestive heart failure)    Continue iv diuresis as renal function surprisingly continues to improve  Await speech therapy eval   reordered

## 2013-04-05 ENCOUNTER — Inpatient Hospital Stay (HOSPITAL_COMMUNITY): Payer: Medicare Other

## 2013-04-05 ENCOUNTER — Encounter (HOSPITAL_COMMUNITY): Payer: Self-pay | Admitting: Physician Assistant

## 2013-04-05 MED ORDER — FUROSEMIDE 10 MG/ML IJ SOLN
40.0000 mg | Freq: Two times a day (BID) | INTRAMUSCULAR | Status: DC
  Administered 2013-04-05 – 2013-04-06 (×2): 40 mg via INTRAVENOUS
  Filled 2013-04-05 (×2): qty 4

## 2013-04-05 NOTE — Progress Notes (Addendum)
Pt. family request to speak with a Child psychotherapistsocial worker about possible placement vs. Home health

## 2013-04-05 NOTE — Progress Notes (Signed)
Utilization Review Completed.   Doni Bacha, RN, BSN Nurse Case Manager  

## 2013-04-05 NOTE — Progress Notes (Signed)
Patient Name: Melanie Clements Date of Encounter: 04/05/2013  Active Problems:   Chronic kidney disease (CKD), stage III (moderate)   Biventricular CHF (congestive heart failure)    SUBJECTIVE: Denies SOB, no chest pain. Concerned about her constipation. Family is with her at home.   OBJECTIVE Filed Vitals:   04/04/13 1235 04/04/13 1405 04/04/13 2111 04/05/13 0632  BP:  105/42 127/63 128/62  Pulse: 80 80 80 80  Temp:  97.8 F (36.6 C) 97.6 F (36.4 C) 98.1 F (36.7 C)  TempSrc:  Oral Oral Oral  Resp:  20 20 20   Height:      Weight:    100 lb 0.7 oz (45.38 kg)  SpO2: 94% 96% 96% 96%    Intake/Output Summary (Last 24 hours) at 04/05/13 0943 Last data filed at 04/05/13 [id number]  Gross per 24 hour  Intake   1710 ml  Output   1800 ml  Net    -90 ml   Filed Weights   04/03/13 0650 04/04/13 0649 04/05/13 [id number]  Weight: 106 lb 4.2 oz (48.2 kg) 102 lb 15.3 oz (46.7 kg) 100 lb 0.7 oz (45.38 kg)    PHYSICAL EXAM General: Well developed, well nourished, female in no acute distress. Head: Normocephalic, atraumatic.  Neck: Supple without bruits, JVD 9 cm. Lungs:  Resp regular and unlabored, decreased BS bilateral bases. Heart: RRR, S1, S2, no S3, S4, no murmur; no rub. Abdomen: Soft, non-tender, non-distended, BS + x 4.  Extremities: No clubbing, cyanosis, pedal edema.  Neuro: Alert and oriented X 3. Moves all extremities spontaneously. Psych: Normal affect.  LABS: Basic Metabolic Panel: Recent Labs  04/03/13 0429 04/04/13 0303  NA 144 144  K 3.7 3.8  CL 106 104  CO2 25 27  GLUCOSE 107* 98  BUN 50* 43*  CREATININE 1.78* 1.64*  CALCIUM 9.2 9.2   BNP: Pro B Natriuretic peptide (BNP)  Date/Time Value Ref Range Status  04/01/2013  5:41 PM 23418.0* 0 - 450 pg/mL Final  12/04/2011  1:57 PM 715.0* 0.0 - 100.0 pg/mL Final   TELE: v pacing w/ occasional PVCs       CXR:  Dg Chest 2 View 04/01/2013   CLINICAL DATA:  Congestive heart failure.  EXAM: CHEST  2 VIEW   COMPARISON:  DG CHEST 2 VIEW dated 02/08/2012; DG CHEST 2 VIEW dated 06/22/2010; CT CHEST W/O CM dated 08/03/2009  FINDINGS: Single lead pacer with tip at right ventricle. Midline trachea. Moderate cardiomegaly with atherosclerosis in the transverse aorta. Left greater than right bilateral pleural effusions. The left-sided pleural effusion is slightly decreased. The right-sided pleural effusion is similar to minimally increased. No pneumothorax. Low lung volumes. Mild pulmonary venous congestion. Increased right and similar to slightly improved left base airspace disease. A nodular density projecting over the right lung base on the frontal radiograph likely corresponds to a nodule which was evaluated on prior chest CTs and PET.  IMPRESSION: Cardiomegaly with mild pulmonary venous congestion.  Small bilateral pleural effusions with adjacent atelectasis or infection. Recommend radiographic follow-up until clearing.  Probable chronic right lung base nodule. Recommend attention on follow-up. Presuming this is the same nodule, this was evaluated on prior exams, including back to the CT of 08/03/2009.   Electronically Signed   By: Jeronimo GreavesKyle  Talbot M.D.   On: 04/01/2013 20:31   Dg Esophagus 04/03/2013   CLINICAL DATA:  Dysphagia.  Difficulty swallowing.  EXAM: ESOPHOGRAM/BARIUM SWALLOW  TECHNIQUE: Single contrast examination was performed using thin and  thick barium and 13 mm barium tablet.  COMPARISON:  None.  FLUOROSCOPY TIME:  2 min 8 seconds  FINDINGS: The patient has a poor secondary stripping wave and occasional tertiary contractions in the esophagus. There is no stricture or mass or hiatal hernia. A 13 mm barium tablet passed from the mouth to the stomach without significant delay. The patient did have prolonged oral stage difficulty swallowing the tablet.  IMPRESSION: 1. Diffuse esophageal motility disorder with a poor secondary stripping waves and occasional tertiary contractions. 2. Oral stage difficulty swallowing the  tablet. 3. Otherwise normal.   Electronically Signed   By: Geanie Cooley M.D.   On: 04/03/2013 10:24   Current Medications:  . antiseptic oral rinse  15 mL Mouth Rinse BID  . aspirin EC  81 mg Oral Daily  . brinzolamide  1 drop Both Eyes TID  . carvedilol  3.125 mg Oral BID WC  . heparin  5,000 Units Subcutaneous BID  . latanoprost  1 drop Both Eyes QHS  . loteprednol  1 drop Right Eye BID  . pantoprazole  40 mg Oral Daily  . sodium chloride  3 mL Intravenous Q12H      ASSESSMENT AND PLAN: 80 yo female with combined CHF, PPM was admitted 02/24 with AoC CHF.  Principal Problem:   Biventricular CHF (congestive heart failure) - I/O not strongly negative but weight is decreasing, IV Lasix yesterday, will give again today, recheck CXR.   Active Problems:   Chronic kidney disease (CKD), stage III (moderate) - improving, continue to follow daily, K+ holding steady    PPM - St Jude, functioning well  Plan: possible d/c in am if doing well.  Melida Quitter , PA-C 9:43 AM 04/05/2013

## 2013-04-06 LAB — BASIC METABOLIC PANEL
BUN: 32 mg/dL — ABNORMAL HIGH (ref 6–23)
CO2: 32 mEq/L (ref 19–32)
Calcium: 9.2 mg/dL (ref 8.4–10.5)
Chloride: 104 mEq/L (ref 96–112)
Creatinine, Ser: 1.31 mg/dL — ABNORMAL HIGH (ref 0.50–1.10)
GFR, EST AFRICAN AMERICAN: 39 mL/min — AB (ref >90)
GFR, EST NON AFRICAN AMERICAN: 34 mL/min — AB (ref >90)
Glucose, Bld: 99 mg/dL (ref 70–99)
Potassium: 3.9 mEq/L (ref 3.7–5.3)
SODIUM: 146 meq/L (ref 137–147)

## 2013-04-06 MED ORDER — FUROSEMIDE 20 MG PO TABS: 20.0000 mg | ORAL_TABLET | Freq: Every day | ORAL | Status: DC

## 2013-04-06 NOTE — Discharge Summary (Signed)
CARDIOLOGY DISCHARGE SUMMARY    Patient ID: Melanie Clements,  MRN: [id number], DOB/AGE: 78/06/1917 78 y.o.  Admit date: 04/01/2013 Discharge date: 04/06/2013  Primary Care Physician: Illene RegulusMichael Norins, MD Primary Cardiologist: Berton MountSteve Klein, MD  Primary Discharge Diagnoses:  1. Acute on chronic combined systolic/ diastolic dysfunction, LVEF 40%   - Improved with diuresis   - Continue current medical therapy   - Strict I/O, daily weights  2. Complete heart block s/p VVI PPM implant previously  - Normal pacemaker function  3. Acute on chronic renal failure   - Improved and stable BUN/Cr at discharge 4. Dysphagia   - GI study and Speech Therapy eval noted  5. Physical deconditioning  - Home Health services ordered  Secondary Discharge Diagnoses:  1. History of GI bleed 2. Glaucoma  Procedures This Admission:  1. Barium swallow study FINDINGS: The patient has a poor secondary stripping wave and occasional tertiary contractions in the esophagus. There is no stricture or mass or hiatal hernia. A 13 mm barium tablet passed from the mouth to the stomach without significant delay. The patient did have prolonged oral stage difficulty swallowing the tablet. IMPRESSION: 1. Diffuse esophageal motility disorder with a poor secondary stripping waves and occasional tertiary contractions. 2. Oral stage difficulty swallowing the tablet. 3. Otherwise normal.  History: Melanie Clements is a 80 year old woman with CHB s/p PPM implant, CHF with combined systolic and diastolic dysfunction EF of 35-40% who [facility] to [facility] on 04/01/2013 with complaints of LE swelling and dyspnea on exertion. She described 1 week of symptoms of LE swelling, worsening dyspnea with exertion, fatigue and weight gain. Reports weight on Saturday was approx 112 lbs, above her threshold to double lasix of 111 lbs. On Saturday and Sunday, she took lasix 20 mg BID (separated due to borderline blood pressures in the  90s) and followed up in the clinic on Monday for blood work which showed worsening renal function. She was then told to hold Lasix. She presented same day with the above, found to have acute on chronic HF and renal dysfunction. She had not missed any doses of medications (ACEI, lasix etc). No chest pain, PND or orthopnea. No fevers or chills. She did reports some difficulty chewing food and swallowing.   Hospital Course:  Melanie Clements [facility]. 12-lead ECG revealed AV dysynchrony with pacing (VVI pacer with underlying complete heart block). CXR with small bilateral effusions, cardiomegaly and congestion. Labs are significant for Cr of 2.5, BNP of 23,000, troponin of 0.35. She was diuresed with IV Lasix. HF improved and her weight is down 13 pounds from admission. While here, a barium swallow evaluation was done with results as outlined above. She was then evaluated by Speech Therapy. No follow-up recommended. Physical therapy recommended continued PT services. Discharge planning done in conjunction with patient's family and Case Management. On 04/06/2013, she was seen, examined and deemed stable for discharge to home by Dr. Wyline MoodBranch. She will be discharged home with family and Home Health services including RN, nursing aide and PT. Dr. Wyline MoodBranch has recommended increased Lasix dosing for the next 3 days then resuming 40 mg once daily thereafter with close / early follow-up in the CHF clinic on Friday, March 6. She will have repeat BMET at that time and further adjustment of her Lasix dosing if needed.   Discharge Vitals: Blood pressure 129/56, pulse 81, temperature 97.5 F (36.4 C), temperature source Oral, resp. rate 20, height 5\' 7"  (1.702  m), weight 99 lb 3.3 oz (45 kg), SpO2 96.00%.   Labs: Lab Results  Component Value Date   WBC 5.5 04/02/2013   HGB 12.6 04/02/2013   HCT 39.2 04/02/2013   MCV 87.7 04/02/2013   PLT 172 04/02/2013     Recent Labs Lab 04/06/13 0515  NA 146  K 3.9    CL 104  CO2 32  BUN 32*  CREATININE 1.31*  CALCIUM 9.2  GLUCOSE 99   Lab Results  Component Value Date   CKTOTAL 119 06/22/2010   CKMB 5.9* 06/22/2010   TROPONINI 0.31* 04/01/2013     Disposition:  The patient is being discharged in stable condition.  Follow-up:     Follow-up Information   Follow up with [facility]. (To provide RN, nursing aide and PT services)    Specialty:  Home Health Services   Contact information:   [address] [place] [address] [phone]       Follow up with [facility] On 04/11/2013. (At 8:30 AM for CHF follow-up and labs (BMET))    Specialty:  Cardiology   Contact information:   [address] [id number] [place] [address] [phone]      Follow up with Illene Regulus, MD. Schedule an appointment as soon as possible for a visit in 1 week. (For hospital follow-up )    Specialty:  Internal Medicine   Contact information:   [address] [place] [address] [phone]      Discharge Medications:    Medication List    STOP taking these medications       aspirin EC 81 MG tablet     enalapril 10 MG tablet  Commonly known as:  VASOTEC      TAKE these medications       brinzolamide 1 % ophthalmic suspension  Commonly known as:  AZOPT  Place 1 drop into both eyes 3 (three) times daily.     carvedilol 3.125 MG tablet  Commonly known as:  COREG  Take 3.125 mg by mouth 2 (two) times daily with a meal.     EYE LUBRICANT Oint  Apply 1 application to eye at bedtime.     furosemide 20 MG tablet  Commonly known as:  LASIX  Take 1-2 tablets (20-40 mg total) by mouth daily. Take 2 tabs (40 mg) twice daily for 3 days, then resume 40 mg once daily thereafter. To have repeat labs in one week for further adustment.     latanoprost 0.005 % ophthalmic solution  Commonly known as:  XALATAN  Place 1 drop into both eyes at bedtime.      loratadine 10 MG tablet  Commonly known as:  CLARITIN  Take 10 mg by mouth daily as needed for allergies.     loteprednol 0.5 % ophthalmic suspension  Commonly known as:  LOTEMAX  Place 1 drop into the right eye 2 (two) times daily.     mineral oil liquid  Take 15 mLs by mouth daily as needed for constipation.     pantoprazole 40 MG tablet  Commonly known as:  PROTONIX  Take 40 mg by mouth daily.     TUMS LASTING EFFECTS 500 MG chewable tablet  Generic drug:  calcium carbonate  Chew 1 tablet by mouth 2 (two) times daily as needed. For heartburn       Duration of Discharge Encounter: Greater than 30 minutes including physician time.  Signed, Rick Duff, PA-C 04/06/2013, 11:05 AM

## 2013-04-06 NOTE — Progress Notes (Signed)
Patient: Melanie Clements Date of Encounter: 04/06/2013, 8:03 AM Admit date: 04/01/2013     Subjective  Ms. Lin GivensJeffries is sleepy this AM. She reports persistent dysphagia. She denies CP or SOB.   Objective  Physical Exam: Vitals: BP 129/56  Pulse 81  Temp(Src) 97.5 F (36.4 C) (Oral)  Resp 20  Ht 5\' 7"  (1.702 m)  Wt 99 lb 3.3 oz (45 kg)  BMI 15.53 kg/m2  SpO2 96% General: Thin, frail appearing 80 year old female in no acute distress. Neck: Supple. JVD not elevated. Lungs: Clear bilaterally to auscultation without wheezes, rales, or rhonchi. Breathing is unlabored. Heart: RRR S1 S2 without murmur, rub or gallop.  Abdomen: Soft, non-distended. Extremities: No clubbing or cyanosis. Trace pedal edema bilaterally.   Neuro: Alert. Moves all extremities spontaneously. No focal deficits.  Intake/Output:  Intake/Output Summary (Last 24 hours) at 04/06/13 0803 Last data filed at 04/06/13 0359  Gross per 24 hour  Intake    480 ml  Output    625 ml  Net   -145 ml    Inpatient Medications:  . antiseptic oral rinse  15 mL Mouth Rinse BID  . aspirin EC  81 mg Oral Daily  . brinzolamide  1 drop Both Eyes TID  . carvedilol  3.125 mg Oral BID WC  . furosemide  40 mg Intravenous BID  . heparin  5,000 Units Subcutaneous BID  . latanoprost  1 drop Both Eyes QHS  . loteprednol  1 drop Right Eye BID  . pantoprazole  40 mg Oral Daily  . sodium chloride  3 mL Intravenous Q12H    Labs:  Recent Labs  04/04/13 0303 04/06/13 0515  NA 144 146  K 3.8 3.9  CL 104 104  CO2 27 32  GLUCOSE 98 99  BUN 43* 32*  CREATININE 1.64* 1.31*  CALCIUM 9.2 9.2    Radiology/Studies: Dg Chest 2 View 04/05/2013     FINDINGS: Left subclavian pacemaker stable. Bilateral pleural effusions left greater than right. Adjacent atelectasis/ consolidation in the lower lobes. Nodular density at the right lung base is again noted, probably corresponding to the Stable 16 mm nodule seen on CT scans back to  08/11/2008. Heart size upper limits normal. Atheromatous aortic arch. Mild pulmonary vascular congestion and interstitial edema.   IMPRESSION: 1. Little change in cardiomegaly, pleural effusions, and bilateral interstitial edema.    Electronically Signed   By: Oley Balmaniel  Hassell M.D.   On: 04/05/2013 14:42   Dg Chest 2 View 04/01/2013    FINDINGS: Single lead pacer with tip at right ventricle. Midline trachea. Moderate cardiomegaly with atherosclerosis in the transverse aorta. Left greater than right bilateral pleural effusions. The left-sided pleural effusion is slightly decreased. The right-sided pleural effusion is similar to minimally increased. No pneumothorax. Low lung volumes. Mild pulmonary venous congestion. Increased right and similar to slightly improved left base airspace disease. A nodular density projecting over the right lung base on the frontal radiograph likely corresponds to a nodule which was evaluated on prior chest CTs and PET.   IMPRESSION: Cardiomegaly with mild pulmonary venous congestion.  Small bilateral pleural effusions with adjacent atelectasis or infection. Recommend radiographic follow-up until clearing.  Probable chronic right lung base nodule. Recommend attention on follow-up. Presuming this is the same nodule, this was evaluated on prior exams, including back to the CT of 08/03/2009.    Electronically Signed   By: Jeronimo GreavesKyle  Talbot M.D.   On: 04/01/2013 20:31   Dg Esophagus  04/03/2013     FINDINGS: The patient has a poor secondary stripping wave and occasional tertiary contractions in the esophagus. There is no stricture or mass or hiatal hernia. A 13 mm barium tablet passed from the mouth to the stomach without significant delay. The patient did have prolonged oral stage difficulty swallowing the tablet.   IMPRESSION: 1. Diffuse esophageal motility disorder with a poor secondary stripping waves and occasional tertiary contractions. 2. Oral stage difficulty swallowing the tablet.  3. Otherwise normal.    Electronically Signed   By: Geanie Cooley M.D.   On: 04/03/2013 10:24    Telemetry: V paced   Assessment and Plan   1. Acute on chronic combined systolic/ diastolic dysfunction, LVEF 40%  Improved with diuresis.  Continue current medical therapy  Strict I/O, daily weights   2. Complete heart block  Normal pacemaker function  3. Acute on chronic renal failure  Improved with duiresis   4. Dysphagia  GI study and Speech Therapy eval noted   Dr. Wyline Mood to see Signed, Rick Duff PA-C  80 yo female admitted with acute on chronic systolic/diastolic heart failure, net negative 5.7 liters since admission with improved symptoms. Renal function improved with diuresis. Will continue to hold ACE-I given recent AKI, she will need close follow up and repeat labs in 1-2 weeks, can resume ACE-I as outpatient if renal function remains stable. Still with some evidence of some extra volume on exam, recommend lasix 40mg  bid x 3 days, then resume 40mg  once daily until follow up within 1-2 weeks with labs.   Dina Rich MD

## 2013-04-06 NOTE — Progress Notes (Signed)
Spoke with Darel HongJudy regarding discharge plan via phone Darel HongJudy to get pt within and hour.

## 2013-04-06 NOTE — Progress Notes (Signed)
   CARE MANAGEMENT NOTE 04/06/2013  Patient:  Melanie Clements,Melanie Clements   Account Number:  [id number]  Date Initiated:  04/06/2013  Documentation initiated by:  [facility]Na[facility] Diegel  Subjective/Objective Assessment:   adm: leg swelling and DOE; CHF     Action/Plan:   discharge planning   Anticipated DC Date:  04/06/2013   Anticipated DC Plan:  HOME W HOME HEALTH SERVICES      DC Planning Services  CM consult      Choice offered to / List presented to:          [facility]  [facility] RN  HH-10 DISEASE MANAGEMENT  HH-2 PT  HH-4 NURSE'S AIDE      Status of service:  Completed, signed off Medicare Important Message given?   (If response is "NO", the following Medicare IM given date fields will be blank) Date Medicare IM given:   Date Additional Medicare IM given:    Discharge Disposition:  HOME W HOME HEALTH SERVICES  Per UR Regulation:    If discussed at Long Length of Stay Meetings, dates discussed:    Comments:  04/06/13 13:30 CM spoke with pt and daughter in room concerning HH.  Daughter, Darel HongJudy, states [facility] in place in Dr. Arthur HolmsNorrins office.  CM called [facility] and spoke with Drinda ButtsAnnette who verified HHPT/RN/aide.  Annette requested CM fax orders, facesheet and discharge summary.  CM faxed requested info.  No other CM needs were communicated. Freddy JakschSarah Fidalgo, BSN, CM [phone].

## 2013-04-06 NOTE — ED Provider Notes (Signed)
Medical screening examination/treatment/procedure(s) were performed by non-physician practitioner and as supervising physician I was immediately available for consultation/collaboration.    Freddie Dymek L Allen Basista, MD 04/06/13 0815 

## 2013-04-06 NOTE — Progress Notes (Signed)
Pt having diarrhea this am, no complains of abdomin pain. Spoke with [place], [place] no new orders. Continue with discharge home today.

## 2013-04-10 ENCOUNTER — Telehealth: Payer: Self-pay

## 2013-04-10 NOTE — Telephone Encounter (Signed)
Phone call from Hughes BetterCynthia Stephens RN [phone]. Patient has an appt on 04/15/13. She states patient is alert. Moderate SOB on exertion. Family looking for patient to have pallative care for chronic congestive heart failure. Home health and social worker ordered.

## 2013-04-11 ENCOUNTER — Ambulatory Visit (HOSPITAL_COMMUNITY)
Admission: RE | Admit: 2013-04-11 | Discharge: 2013-04-11 | Disposition: A | Discharge status: Home / Self Care | Payer: Medicare Other | Source: Ambulatory Visit | Attending: Internal Medicine | Admitting: Internal Medicine

## 2013-04-11 ENCOUNTER — Encounter (HOSPITAL_COMMUNITY): Payer: 59

## 2013-04-11 ENCOUNTER — Encounter (HOSPITAL_COMMUNITY): Payer: Self-pay

## 2013-04-11 VITALS — BP 102/58 | HR 78 | Resp 18 | Wt 97.5 lb

## 2013-04-11 DIAGNOSIS — I509 Heart failure, unspecified: Secondary | ICD-10-CM

## 2013-04-11 DIAGNOSIS — I5022 Chronic systolic (congestive) heart failure: Secondary | ICD-10-CM | POA: Insufficient documentation

## 2013-04-11 DIAGNOSIS — I5082 Biventricular heart failure: Secondary | ICD-10-CM

## 2013-04-11 MED ORDER — FUROSEMIDE 80 MG PO TABS: 80.0000 mg | ORAL_TABLET | Freq: Every day | ORAL | Status: DC

## 2013-04-11 NOTE — Telephone Encounter (Signed)
Incoming call from CuttenEvette with hospice and pallative care [phone] stating she received a call from Amy Long with heart failure wanting to know if you'll be the attending doctor for the patient.

## 2013-04-11 NOTE — Progress Notes (Signed)
Patient ID: Melanie Clements, female   DOB: 06/26/1917, 80 y.o.   MRN: [id number]  Referring Physician: Dr. Debby BudNorins Primary Care: Dr. Debby BudNorins Primary Cardiologist: Dr. Graciela HusbandsKlein  HPI: Melanie Clements is a 80 y.o. AA female with past medical history significant for HTN, HL, CHB s/p St. Jude PPM 10/2011.  Combined diastolic/systolic  heart failure. ECHO 02/21/12 showed LVEF 35-40% with diffuse hypokinesis.There was severe biventricular hypertrophy.  RV with mildly reduced systolic function.  Mod dilated RA.  Mod TR.  No PAH.  [facility] 04/06/13 with volume overload. Diuresed with IV lasix and transitioned to lasix 80 daily for 3 days then she started 40 mg daily. Discharge weight 99 pounds. [facility].    She returns for follow up with her daughters, Dondra SpryGail and Darel HongJudy. Complains of dyspnea at rest and with exertion. Denies PND. + Orthopnea. Weight at home 96 pounds.Appetitie poor.  Able to walk a few steps with assistance. Requires assistance with all ADLs from her daughters.  [facility]. [facility]    Past Medical History  Diagnosis Date  . Acute upper gastrointestinal hemorrhage     2008  . Stiffness of joint, lower leg   . Constipation   . Weakness   . Dyspepsia   . Pulmonary nodule     right upper lobe  . Anemia   . Osteoporosis   . Glaucoma   . Hypertension   . Hyperlipidemia   . Atrioventricular block, complete   . CHF (congestive heart failure)     diastolic and systolic (EF 35-40%)  . Pacemaker-St.Jude-single chamber 10/18/2011    Current Outpatient Prescriptions  Medication Sig Dispense Refill  . Artificial Tear Ointment (EYE LUBRICANT) OINT Apply 1 application to eye at bedtime.      . brinzolamide (AZOPT) 1 % ophthalmic suspension Place 1 drop into both eyes 3 (three) times daily.      . calcium carbonate (TUMS LASTING EFFECTS) 500 MG chewable tablet Chew 1 tablet by mouth 2 (two) times daily as needed. For heartburn       . carvedilol (COREG) 3.125 MG tablet Take 3.125 mg by mouth 2 (two) times daily with a meal.      . furosemide (LASIX) 20 MG tablet Take 40 mg by mouth daily.      Marland Kitchen. latanoprost (XALATAN) 0.005 % ophthalmic solution Place 1 drop into both eyes at bedtime.      Marland Kitchen. loratadine (CLARITIN) 10 MG tablet Take 10 mg by mouth daily as needed for allergies.       Marland Kitchen. loteprednol (LOTEMAX) 0.5 % ophthalmic suspension Place 1 drop into the right eye 2 (two) times daily.       . mineral oil liquid Take 15 mLs by mouth daily as needed for constipation.  180 mL  12  . pantoprazole (PROTONIX) 40 MG tablet Take 40 mg by mouth daily.       No current facility-administered medications for this encounter.    Allergies  Allergen Reactions  . Risedronate Sodium Diarrhea    History   Social History  . Marital Status: Widowed    Spouse Name: N/A    Number of Children: N/A  . Years of Education: N/A   Occupational History  . Not on file.   Social History Main Topics  . Smoking status: Never Smoker   . Smokeless tobacco: Current User    Types: Snuff  . Alcohol Use: No  . Drug Use:  No  . Sexual Activity: No   Other Topics Concern  . Not on file   Social History Narrative   Pt lives alone but has a supportive family and someone is with her most of the time. Both parents and all her siblings are deceased, none with cardiac issues.Marland KitchenMarland KitchenShe has a very supportive family. remains I-ADLs except for driving..  ACP/End of Life - Care: discussed with patinet and dtr: DNR. Completed MOST - DNR, Limited Additional interventions, Antibiotic with thoughtfullness, time trial of IVF and enteral nutrition.    No family history on file.  PHYSICAL EXAM: Filed Vitals:   04/11/13 1114  BP: 102/58  Pulse: 78  Resp: 18  Weight: 97 lb 8 oz (44.226 kg)  SpO2: 97%    General:  Elderly. Thin. Chronical ill appearing sitting in the wheelchair. No respiratory difficulty 2 daughters present.  HEENT: normal Neck: supple.  JVP ~10. Carotids 2+ bilat; no bruits. No lymphadenopathy or thryomegaly appreciated. Cor: PMI nondisplaced. Regular rate & rhythm. TR 3/6 Lungs: clear Abdomen: soft, nontender, minimally distended. No hepatosplenomegaly. No bruits or masses. Good bowel sounds. Extremities: no cyanosis, clubbing, rash, R and LLE 3+ edema Neuro: alert & oriented x 3, cranial nerves grossly intact. moves all 4 extremities w/o difficulty. Affect pleasant.   ASSESSMENT & PLAN: 1.End Stage Biventricular Heart FailureChronic systolic HF, unclear etiology. Post hospital follow up with NYHA III-IV symptoms. Today she is volume overloaded despite being well under her dry weight. Will  increase lasix to 80 mg daily to see if that will help with dyspnea and edema.  Discussed with her and daughters and that she is declining and they agree and requested hospice referral. Will need to focus on comfort and liberalize diet as she desires.     2. Immobility   Refer to [facility]. Follow up in 1 month.      CLEGG,AMY, NP-C  11:39 AM

## 2013-04-11 NOTE — Patient Instructions (Signed)
Follow up in 1-2 months  Take lasix 80 mg daily  Do the following things EVERYDAY: 1) Weigh yourself in the morning before breakfast. Write it down and keep it in a log. 2) Take your medicines as prescribed 3) Eat low salt foods-Limit salt (sodium) to 2000 mg per day.  4) Stay as active as you can everyday 5) Limit all fluids for the day to less than 2 liters

## 2013-04-11 NOTE — Telephone Encounter (Signed)
Since I am retiring march 27th it would only be short term. Best to consider staff MD as attending.

## 2013-04-11 NOTE — Telephone Encounter (Signed)
I [facility] to [facility] and let her know staff MD would be better since he's retiring

## 2013-04-14 ENCOUNTER — Encounter: Payer: Self-pay | Admitting: Internal Medicine

## 2013-04-14 ENCOUNTER — Ambulatory Visit (INDEPENDENT_AMBULATORY_CARE_PROVIDER_SITE_OTHER): Admitting: *Deleted

## 2013-04-14 DIAGNOSIS — I498 Other specified cardiac arrhythmias: Secondary | ICD-10-CM

## 2013-04-14 LAB — MDC_IDC_ENUM_SESS_TYPE_INCLINIC
Battery Impedance: 1000 Ohm — CL
Battery Voltage: 2.79 V
Brady Statistic RV Percent Paced: 94 %
Date Time Interrogation Session: 20150309154709
Implantable Pulse Generator Model: 5620
Implantable Pulse Generator Serial Number: 7303390
Lead Channel Impedance Value: 599 Ohm
Lead Channel Pacing Threshold Amplitude: 0.625 V
Lead Channel Pacing Threshold Pulse Width: 0.4 ms
Lead Channel Sensing Intrinsic Amplitude: 5.8 mV
Lead Channel Setting Pacing Pulse Width: 0.4 ms
Lead Channel Setting Sensing Sensitivity: 2 mV

## 2013-04-14 NOTE — Progress Notes (Signed)
Pacemaker check in clinic. Normal device function. Thresholds, sensing, impedances consistent with previous measurements. Device programmed to maximize longevity. No high ventricular rates noted. Device programmed at appropriate safety margins. Histogram distribution appropriate for patient activity level. Device programmed to optimize intrinsic conduction.  Base rate to 70 today.   Estimated longevity 6 years.  Patient education completed.  ROV 6 months with Dr. Graciela HusbandsKlein.

## 2013-04-15 ENCOUNTER — Ambulatory Visit: Payer: 59 | Admitting: Internal Medicine

## 2013-04-16 ENCOUNTER — Other Ambulatory Visit: Payer: Self-pay | Admitting: Internal Medicine

## 2013-04-21 ENCOUNTER — Telehealth (HOSPITAL_COMMUNITY): Payer: Self-pay | Admitting: Cardiology

## 2013-04-21 NOTE — Telephone Encounter (Signed)
During pts Hospice visit today her b/p 90/50 Pt is asymptomatic  Deanna ArtisKeisha would like to know if Dr. Gala RomneyBensimhon would like to keep her on Coreg and Lasix  Please advise

## 2013-04-21 NOTE — Telephone Encounter (Signed)
Per Dr Gala RomneyBensimhon stop Carvedilol, continue Lasix, Deanna ArtisKeisha is aware and will notify family

## 2013-04-22 ENCOUNTER — Ambulatory Visit: Payer: 59 | Admitting: Internal Medicine

## 2013-05-01 ENCOUNTER — Telehealth (HOSPITAL_COMMUNITY): Payer: Self-pay | Admitting: Cardiology

## 2013-05-01 NOTE — Telephone Encounter (Signed)
Nurse called after home visit with concerns/ update Weight decreased to 85.4 lb last week weight 92.4 lbs Carvedilol was decreased due to hypotension however b/p today 88/50 Pt is still taking Lasix 80 mg no edema, very dry, pt is only drinking very little   Please advise Per vo Ulla PotashAli Cosgrove hold Lasix x 2days Call with update if needed

## 2013-05-12 ENCOUNTER — Ambulatory Visit (HOSPITAL_COMMUNITY)
Admission: RE | Admit: 2013-05-12 | Discharge: 2013-05-12 | Disposition: A | Discharge status: Home / Self Care | Source: Ambulatory Visit | Attending: Internal Medicine | Admitting: Internal Medicine

## 2013-05-12 VITALS — BP 106/54 | HR 71 | Wt 84.0 lb

## 2013-05-12 DIAGNOSIS — I5042 Chronic combined systolic (congestive) and diastolic (congestive) heart failure: Secondary | ICD-10-CM | POA: Insufficient documentation

## 2013-05-12 DIAGNOSIS — I10 Essential (primary) hypertension: Secondary | ICD-10-CM

## 2013-05-12 DIAGNOSIS — I129 Hypertensive chronic kidney disease with stage 1 through stage 4 chronic kidney disease, or unspecified chronic kidney disease: Secondary | ICD-10-CM | POA: Insufficient documentation

## 2013-05-12 DIAGNOSIS — R5381 Other malaise: Secondary | ICD-10-CM | POA: Insufficient documentation

## 2013-05-12 DIAGNOSIS — R5383 Other fatigue: Secondary | ICD-10-CM

## 2013-05-12 DIAGNOSIS — N183 Chronic kidney disease, stage 3 unspecified: Secondary | ICD-10-CM | POA: Insufficient documentation

## 2013-05-12 DIAGNOSIS — I509 Heart failure, unspecified: Secondary | ICD-10-CM

## 2013-05-12 DIAGNOSIS — I5082 Biventricular heart failure: Secondary | ICD-10-CM

## 2013-05-12 LAB — BASIC METABOLIC PANEL
BUN: 54 mg/dL — ABNORMAL HIGH (ref 6–23)
CO2: 25 mEq/L (ref 19–32)
CREATININE: 1.65 mg/dL — AB (ref 0.50–1.10)
Calcium: 9.6 mg/dL (ref 8.4–10.5)
Chloride: 101 mEq/L (ref 96–112)
GFR calc non Af Amer: 26 mL/min — ABNORMAL LOW (ref >90)
GFR, EST AFRICAN AMERICAN: 30 mL/min — AB (ref >90)
Glucose, Bld: 135 mg/dL — ABNORMAL HIGH (ref 70–99)
POTASSIUM: 3.9 meq/L (ref 3.7–5.3)
Sodium: 142 mEq/L (ref 137–147)

## 2013-05-12 MED ORDER — FUROSEMIDE 40 MG PO TABS: ORAL_TABLET | ORAL | Status: DC

## 2013-05-12 NOTE — Patient Instructions (Signed)
Start taking your lasix 40 mg (1 tablet) alternating with 80 mg (2 tablets) every other day. If her weight starts trending up or you notice that she has swelling in her lower legs or shortness of breath you can give her 80 mg daily.  Call any issues.  F/U 2 months  Do the following things EVERYDAY: 1) Weigh yourself in the morning before breakfast. Write it down and keep it in a log. 2) Take your medicines as prescribed 3) Eat low salt foods-Limit salt (sodium) to 2000 mg per day.  4) Stay as active as you can everyday 5) Limit all fluids for the day to less than 2 liters 6)

## 2013-05-12 NOTE — Progress Notes (Addendum)
Patient ID: Melanie Clements, female   DOB: 10-02-1917, 80 y.o.   MRN: [id number]  Referring Physician: Dr. Debby Bud Primary Care: Dr. Debby Bud Primary Cardiologist: Dr. Graciela Husbands  HPI: Melanie Clements is a 28 y.o. AA female with past medical history significant for HTN, HL, CHB s/p St. Jude PPM 10/2011.  Combined diastolic/systolic  heart failure. ECHO 02/21/12 showed LVEF 35-40% with diffuse hypokinesis.There was severe biventricular hypertrophy.  RV with mildly reduced systolic function.  Mod dilated RA.  Mod TR.  No PAH.  Admitted to [facility] 2/24 through 04/06/13 with volume overload. Diuresed with IV lasix and transitioned to lasix 80 daily for 3 days then she started 40 mg daily. Discharge weight 99 pounds. Referred to [facility].    Follow up: Last visit increased lasix to 80 mg daily and was referred to Hospice. No longer on coreg. Doing well. Minimal SOB. Denies CP, orthopnea, or PND. Walking some with minimal SOB. Needs assistance with ADLs. Following a low salt diet and drinking less than 2L a day.    Past Medical History  Diagnosis Date  . Acute upper gastrointestinal hemorrhage     2008  . Stiffness of joint, lower leg   . Constipation   . Weakness   . Dyspepsia   . Pulmonary nodule     right upper lobe  . Anemia   . Osteoporosis   . Glaucoma   . Hypertension   . Hyperlipidemia   . Atrioventricular block, complete   . CHF (congestive heart failure)     diastolic and systolic (EF 35-40%)  . Pacemaker-St.Jude-single chamber 10/18/2011    Current Outpatient Prescriptions  Medication Sig Dispense Refill  . Artificial Tear Ointment (EYE LUBRICANT) OINT Apply 1 application to eye at bedtime.      . brinzolamide (AZOPT) 1 % ophthalmic suspension Place 1 drop into both eyes 3 (three) times daily.      . calcium carbonate (TUMS LASTING EFFECTS) 500 MG chewable tablet Chew 1 tablet by mouth 2 (two) times daily as needed. For heartburn      . furosemide (LASIX) 80 MG tablet Take 1 tablet (80 mg  total) by mouth daily.  30 tablet  6  . latanoprost (XALATAN) 0.005 % ophthalmic solution Place 1 drop into both eyes at bedtime.      Marland Kitchen loratadine (CLARITIN) 10 MG tablet Take 10 mg by mouth daily as needed for allergies.       Marland Kitchen loteprednol (LOTEMAX) 0.5 % ophthalmic suspension Place 1 drop into the right eye 2 (two) times daily.       . mineral oil liquid Take 15 mLs by mouth daily as needed for constipation.  180 mL  12  . pantoprazole (PROTONIX) 40 MG tablet Take 40 mg by mouth daily.       No current facility-administered medications for this encounter.    Allergies  Allergen Reactions  . Risedronate Sodium Diarrhea    History   Social History  . Marital Status: Widowed    Spouse Name: N/A    Number of Children: N/A  . Years of Education: N/A   Occupational History  . Not on file.   Social History Main Topics  . Smoking status: Never Smoker   . Smokeless tobacco: Current User    Types: Snuff  . Alcohol Use: No  . Drug Use: No  . Sexual Activity: No   Other Topics Concern  . Not on file   Social History Narrative   Pt lives alone  but has a supportive family and someone is with her most of the time. Both parents and all her siblings are deceased, none with cardiac issues.Marland Kitchen.Marland Kitchen.She has a very supportive family. remains I-ADLs except for driving..  ACP/End of Life - Care: discussed with patinet and dtr: DNR. Completed MOST - DNR, Limited Additional interventions, Antibiotic with thoughtfullness, time trial of IVF and enteral nutrition.    No family history on file.   Filed Vitals:   05/12/13 1120  BP: 106/54  Pulse: 71  Weight: 84 lb (38.102 kg)  SpO2: 97%    PHYSICAL EXAM: General:  Elderly. Thin. Chronical ill appearing sitting in the wheelchair. No respiratory difficulty 2 daughters present.  HEENT: normal Neck: supple. JVP 7-8 Carotids 2+ bilat; no bruits. No lymphadenopathy or thryomegaly appreciated. Cor: PMI nondisplaced. Regular rate & rhythm. TR  3/6 Lungs: clear Abdomen: soft, nontender, minimally distended. No hepatosplenomegaly. No bruits or masses. Good bowel sounds. Extremities: no cyanosis, clubbing, rash, no edema Neuro: alert & oriented x 3, cranial nerves grossly intact. moves all 4 extremities w/o difficulty. Affect pleasant.   ASSESSMENT & PLAN:  1.End Stage Biventricular Heart FailureChronic systolic HF: Unclear etiology.  - NYHA III symptoms and volume status stable. Weight is down 13 lbs since last visit. Will change lasix to 80 mg QOD alternating with 40 mg. Instructed the daughters that if weight is trending up or she starts having SOB or LE edmea they can go back to 80 mg daily. - She has not been on any KCL will check BMET today. - Now enrolled in Hospice. They stopped coreg, will leave off for now, but if stable next visit will try to restart since she does not have any HF medications on board currently. - Reinforced the need and importance of daily weights, a low sodium diet, and fluid restriction (less than 2 L a day). Instructed to call the HF clinic if weight increases more than 3 lbs overnight or 5 lbs in a week.  - F/U 2 months or sooner if needed 2) Deconditioning: - Ambulating some in the house. Stressed the importance of using her walker or cane at all times d/t high fall risk. 3) HTN - Stable. As above not currently on any medications. 4) CKD stage III - Check BMET today.   F/U 2 months Aundria Rudosgrove, Aldine Grainger B, NP-C  11:42 AM

## 2013-05-13 ENCOUNTER — Encounter: Payer: Self-pay | Admitting: Internal Medicine

## 2013-06-03 ENCOUNTER — Other Ambulatory Visit: Payer: Self-pay | Admitting: *Deleted

## 2013-06-03 MED ORDER — PANTOPRAZOLE SODIUM 40 MG PO TBEC: 40.0000 mg | DELAYED_RELEASE_TABLET | Freq: Every day | ORAL | Status: DC

## 2013-07-09 NOTE — Progress Notes (Signed)
Patient ID: Melanie Clements, female   DOB: 04-22-1917, 80 y.o.   MRN: [id number]  Referring Physician: Dr. Debby Bud Primary Care: Dr. Debby Bud Primary Cardiologist: Dr. Graciela Husbands  HPI: Melanie Clements is a 89 y.o. AA female with past medical history significant for HTN, HL, CHB s/p St. Jude PPM 10/2011 and combined diastolic/systolic heart failure.   ECHO 02/21/12 showed LVEF 35-40% with diffuse hypokinesis.There was severe biventricular hypertrophy.  RV with mildly reduced systolic function.  Mod dilated RA.  Mod TR.  No PAH.  Admitted to [facility] 2/24 through 04/06/13 with volume overload. Diuresed with IV lasix and transitioned to lasix 80 daily for 3 days then she started 40 mg daily. Discharge weight 99 pounds.   Follow up for Heart Failure: Last visit decreased lasix to 80 mg daily alternating 40 mg daily. She is now in hospice. Overall feeling pretty good. Denies SOB, CP, orthopnea or PND. Not walking much but gets minimal SOB with walking. Uses cane but wont use walker. She is scared to walk too much because of vision. Weight at home 82-83 lbs. Needs assistance with ADLs. BP with hospice 80-90/60s. No dizziness.  Past Medical History  Diagnosis Date  . Acute upper gastrointestinal hemorrhage     2008  . Stiffness of joint, lower leg   . Constipation   . Weakness   . Dyspepsia   . Pulmonary nodule     right upper lobe  . Anemia   . Osteoporosis   . Glaucoma   . Hypertension   . Hyperlipidemia   . Atrioventricular block, complete   . CHF (congestive heart failure)     diastolic and systolic (EF 35-40%)  . Pacemaker-St.Jude-single chamber 10/18/2011    Current Outpatient Prescriptions  Medication Sig Dispense Refill  . Artificial Tear Ointment (EYE LUBRICANT) OINT Apply 1 application to eye at bedtime.      . brinzolamide (AZOPT) 1 % ophthalmic suspension Place 1 drop into both eyes 3 (three) times daily.      . calcium carbonate (TUMS LASTING EFFECTS) 500 MG chewable tablet Chew 1 tablet by  mouth 2 (two) times daily as needed. For heartburn      . furosemide (LASIX) 40 MG tablet Take 40 mg (1 tablet) daily alternating with 80 mg (2 tablets) every other day.  47 tablet  3  . latanoprost (XALATAN) 0.005 % ophthalmic solution Place 1 drop into both eyes at bedtime.      Marland Kitchen loratadine (CLARITIN) 10 MG tablet Take 10 mg by mouth daily as needed for allergies.       Marland Kitchen loteprednol (LOTEMAX) 0.5 % ophthalmic suspension Place 1 drop into the right eye 2 (two) times daily.       . mineral oil liquid Take 15 mLs by mouth daily as needed for constipation.  180 mL  12  . pantoprazole (PROTONIX) 40 MG tablet Take 1 tablet (40 mg total) by mouth daily.  90 tablet  1   No current facility-administered medications for this encounter.    Allergies  Allergen Reactions  . Risedronate Sodium Diarrhea    History   Social History  . Marital Status: Widowed    Spouse Name: N/A    Number of Children: N/A  . Years of Education: N/A   Occupational History  . Not on file.   Social History Main Topics  . Smoking status: Never Smoker   . Smokeless tobacco: Current User    Types: Snuff  . Alcohol Use: No  . Drug  Use: No  . Sexual Activity: No   Other Topics Concern  . Not on file   Social History Narrative   Pt lives alone but has a supportive family and someone is with her most of the time. Both parents and all her siblings are deceased, none with cardiac issues.Marland Kitchen.Marland Kitchen.She has a very supportive family. remains I-ADLs except for driving..  ACP/End of Life - Care: discussed with patinet and dtr: DNR. Completed MOST - DNR, Limited Additional interventions, Antibiotic with thoughtfullness, time trial of IVF and enteral nutrition.    No family history on file.   Filed Vitals:   07/10/13 1151  BP: 102/52  Pulse: 86  Weight: 85 lb (38.556 kg)  SpO2: 100%    PHYSICAL EXAM: General:  Elderly. Thin. Chronical ill appearing sitting in the wheelchair. No respiratory difficulty 2 daughters  present.  HEENT: normal Neck: supple. JVP 7 Carotids 2+ bilat; no bruits. No lymphadenopathy or thryomegaly appreciated. Cor: PMI nondisplaced. Regular rate & rhythm. TR 3/6 Lungs: clear Abdomen: soft, nontender, minimally distended. No hepatosplenomegaly. No bruits or masses. Good bowel sounds. Extremities: no cyanosis, clubbing, rash, no edema Neuro: alert & oriented x 3, cranial nerves grossly intact. moves all 4 extremities w/o difficulty. Affect pleasant. Back: scoloiosis  ASSESSMENT & PLAN:  1.End Stage Biventricular Heart FailureChronic systolic HF: Unclear etiology.  - NYHA III symptoms and volume status stable. She may be a little dry so will change lasix to 40 mg daily. Discussed with daughters about sliding scale diuretics and that she take extra 40 mg if needed.  - She has not been on any KCL will check BMET and pro-BNP today.  - Now enrolled in Hospice.  - Reinforced the need and importance of daily weights, a low sodium diet, and fluid restriction (less than 2 L a day). Instructed to call the HF clinic if weight increases more than 3 lbs overnight or 5 lbs in a week.  2) Deconditioning: - Ambulating some in the house. Stressed the importance of using her walker or cane at all times d/t high fall risk. Told her to try and be as active as possible.  3) HTN - Stable.  4) CKD stage III - Check BMET today.  F/U 3 months Aundria RudAli B Khaniya Tenaglia, NP-C  11:53 AM

## 2013-07-10 ENCOUNTER — Ambulatory Visit (HOSPITAL_COMMUNITY)
Admission: RE | Admit: 2013-07-10 | Discharge: 2013-07-10 | Disposition: A | Discharge status: Home / Self Care | Source: Ambulatory Visit | Attending: Cardiology | Admitting: Cardiology

## 2013-07-10 VITALS — BP 102/52 | HR 86 | Wt 85.0 lb

## 2013-07-10 DIAGNOSIS — I129 Hypertensive chronic kidney disease with stage 1 through stage 4 chronic kidney disease, or unspecified chronic kidney disease: Secondary | ICD-10-CM | POA: Insufficient documentation

## 2013-07-10 DIAGNOSIS — I5042 Chronic combined systolic (congestive) and diastolic (congestive) heart failure: Secondary | ICD-10-CM | POA: Insufficient documentation

## 2013-07-10 DIAGNOSIS — N183 Chronic kidney disease, stage 3 unspecified: Secondary | ICD-10-CM | POA: Insufficient documentation

## 2013-07-10 DIAGNOSIS — I10 Essential (primary) hypertension: Secondary | ICD-10-CM

## 2013-07-10 DIAGNOSIS — R5381 Other malaise: Secondary | ICD-10-CM

## 2013-07-10 DIAGNOSIS — I509 Heart failure, unspecified: Secondary | ICD-10-CM | POA: Insufficient documentation

## 2013-07-10 LAB — BASIC METABOLIC PANEL
BUN: 47 mg/dL — ABNORMAL HIGH (ref 6–23)
CALCIUM: 9.9 mg/dL (ref 8.4–10.5)
CO2: 29 mEq/L (ref 19–32)
Chloride: 101 mEq/L (ref 96–112)
Creatinine, Ser: 1.37 mg/dL — ABNORMAL HIGH (ref 0.50–1.10)
GFR calc Af Amer: 37 mL/min — ABNORMAL LOW (ref >90)
GFR calc non Af Amer: 32 mL/min — ABNORMAL LOW (ref >90)
Glucose, Bld: 115 mg/dL — ABNORMAL HIGH (ref 70–99)
Potassium: 3.7 mEq/L (ref 3.7–5.3)
SODIUM: 141 meq/L (ref 137–147)

## 2013-07-10 LAB — PRO B NATRIURETIC PEPTIDE: Pro B Natriuretic peptide (BNP): 9948 pg/mL — ABNORMAL HIGH (ref 0–450)

## 2013-07-10 MED ORDER — FUROSEMIDE 40 MG PO TABS: 40.0000 mg | ORAL_TABLET | Freq: Every day | ORAL | Status: DC

## 2013-07-10 NOTE — Patient Instructions (Signed)
Decrease your lasix to 40 mg daily. If she starts having any shortness of breath or swelling she can take an extra 40 mg tablet.  Call any issues.   Try to exercise more and smile more.   Follow up in 3 months  Do the following things EVERYDAY: 1) Weigh yourself in the morning before breakfast. Write it down and keep it in a log. 2) Take your medicines as prescribed 3) Eat low salt foods-Limit salt (sodium) to 2000 mg per day.  4) Stay as active as you can everyday 5) Limit all fluids for the day to less than 2 liters 6)

## 2013-07-11 DIAGNOSIS — R5381 Other malaise: Secondary | ICD-10-CM | POA: Insufficient documentation

## 2013-08-26 ENCOUNTER — Telehealth (HOSPITAL_COMMUNITY): Payer: Self-pay | Admitting: Vascular Surgery

## 2013-10-08 NOTE — Progress Notes (Signed)
Patient ID: Melanie Clements, female   DOB: 20-Feb-1917, 80 y.o.   MRN: [id number]  Referring Physician: Dr. Debby Clements Primary Care: Dr. Debby Clements Primary Cardiologist: Dr. Graciela Clements  HPI: [name] is a 80 y.o. AA female with past medical history significant for HTN, HL, CHB s/p St. Jude PPM 10/2011 and combined diastolic/systolic heart failure.   ECHO 02/21/12 showed LVEF 35-40% with diffuse hypokinesis.There was severe biventricular hypertrophy.  RV with mildly reduced systolic function.  Mod dilated RA.  Mod TR.  No PAH.  Admitted to [facility] 2/24 through 04/06/13 with volume overload. Diuresed with IV lasix and transitioned to lasix 80 daily for 3 days then she started 40 mg daily. Discharge weight 99 pounds.   Follow up for Heart Failure: Last visit decreased lasix to 80 mg daily alternating 40 mg daily. Overall feeling ok. SOB with exertion. + PND. Followed by Hospice. Taking naps throughout the day. Weight a thome 84-85 pounds. Has 24 hour care giver.  Appetite ok.     Past Medical History  Diagnosis Date  . Acute upper gastrointestinal hemorrhage     2008  . Stiffness of joint, lower leg   . Constipation   . Weakness   . Dyspepsia   . Pulmonary nodule     right upper lobe  . Anemia   . Osteoporosis   . Glaucoma   . Hypertension   . Hyperlipidemia   . Atrioventricular block, complete   . CHF (congestive heart failure)     diastolic and systolic (EF 35-40%)  . Pacemaker-St.Jude-single chamber 10/18/2011    Current Outpatient Prescriptions  Medication Sig Dispense Refill  . Artificial Tear Ointment (EYE LUBRICANT) OINT Apply 1 application to eye at bedtime.      . brinzolamide (AZOPT) 1 % ophthalmic suspension Place 1 drop into both eyes 3 (three) times daily.      . calcium carbonate (TUMS LASTING EFFECTS) 500 MG chewable tablet Chew 1 tablet by mouth 2 (two) times daily as needed. For heartburn      . furosemide (LASIX) 40 MG tablet Take 1 tablet (40 mg total) by mouth daily.  35 tablet   3  . latanoprost (XALATAN) 0.005 % ophthalmic solution Place 1 drop into both eyes at bedtime.      Marland Kitchen loratadine (CLARITIN) 10 MG tablet Take 10 mg by mouth daily as needed for allergies.       Marland Kitchen loteprednol (LOTEMAX) 0.5 % ophthalmic suspension Place 1 drop into the right eye 2 (two) times daily.       . mineral oil liquid Take 15 mLs by mouth daily as needed for constipation.  180 mL  12  . pantoprazole (PROTONIX) 40 MG tablet Take 1 tablet (40 mg total) by mouth daily.  90 tablet  1   No current facility-administered medications for this encounter.    Allergies  Allergen Reactions  . Risedronate Sodium Diarrhea    History   Social History  . Marital Status: Widowed    Spouse Name: N/A    Number of Children: N/A  . Years of Education: N/A   Occupational History  . Not on file.   Social History Main Topics  . Smoking status: Never Smoker   . Smokeless tobacco: Current User    Types: Snuff  . Alcohol Use: No  . Drug Use: No  . Sexual Activity: No   Other Topics Concern  . Not on file   Social History Narrative   Pt lives alone but has  a supportive family and someone is with her most of the time. Both parents and all her siblings are deceased, none with cardiac issues.Marland KitchenMarland KitchenShe has a very supportive family. remains I-ADLs except for driving..  ACP/End of Life - Care: discussed with patinet and dtr: DNR. Completed MOST - DNR, Limited Additional interventions, Antibiotic with thoughtfullness, time trial of IVF and enteral nutrition.    No family history on file.   Filed Vitals:   10/09/13 1149  BP: 114/61  Pulse: 69  Resp: 18  Weight: 87 lb 12.8 oz (39.826 kg)  SpO2: 99%    PHYSICAL EXAM: General:  Elderly. Thin. Chronical ill appearing sitting in the wheelchair. No respiratory difficulty 2 daughters present.  HEENT: normal Neck: supple. JVP 5-6. Carotids 2+ bilat; no bruits. No lymphadenopathy or thryomegaly appreciated. Cor: PMI nondisplaced. Regular rate &  rhythm. TR 3/6 Lungs: clear Abdomen: soft, nontender, minimally distended. No hepatosplenomegaly. No bruits or masses. Good bowel sounds. Extremities: no cyanosis, clubbing, rash, no edema Neuro: alert & oriented x 3, cranial nerves grossly intact. moves all 4 extremities w/o difficulty. Affect pleasant. Back: scoloiosis  ASSESSMENT & PLAN:  1.End Stage Biventricular Heart FailureChronic systolic HF: Unclear etiology.  - NYHA III symptoms. Volume status stable. Continue lasix 40 mg.  - -[facility]. She is agreeable to hospital bed and oxygen.  Will contact [facility].  I have told her daughters that she can liberalize diet.  - Reinforced the need and importance of daily weights, a low sodium diet, and fluid restriction (less than 2 L a day). Instructed to call the HF clinic if weight increases more than 3 lbs overnight or 5 lbs in a week.     Follow up in 6 months  Melanie Clements,AMY, NP-C  11:57 AM

## 2013-10-09 ENCOUNTER — Encounter (HOSPITAL_COMMUNITY): Payer: Self-pay

## 2013-10-09 ENCOUNTER — Ambulatory Visit (HOSPITAL_COMMUNITY)
Admission: RE | Admit: 2013-10-09 | Discharge: 2013-10-09 | Disposition: A | Discharge status: Home / Self Care | Source: Ambulatory Visit | Attending: Internal Medicine | Admitting: Internal Medicine

## 2013-10-09 VITALS — BP 114/61 | HR 69 | Resp 18 | Wt 87.8 lb

## 2013-10-09 DIAGNOSIS — Z950 Presence of cardiac pacemaker: Secondary | ICD-10-CM | POA: Diagnosis not present

## 2013-10-09 DIAGNOSIS — I5042 Chronic combined systolic (congestive) and diastolic (congestive) heart failure: Secondary | ICD-10-CM | POA: Diagnosis not present

## 2013-10-09 DIAGNOSIS — K5900 Constipation, unspecified: Secondary | ICD-10-CM | POA: Insufficient documentation

## 2013-10-09 DIAGNOSIS — D649 Anemia, unspecified: Secondary | ICD-10-CM | POA: Diagnosis not present

## 2013-10-09 DIAGNOSIS — E785 Hyperlipidemia, unspecified: Secondary | ICD-10-CM | POA: Insufficient documentation

## 2013-10-09 DIAGNOSIS — H409 Unspecified glaucoma: Secondary | ICD-10-CM | POA: Insufficient documentation

## 2013-10-09 DIAGNOSIS — I10 Essential (primary) hypertension: Secondary | ICD-10-CM | POA: Diagnosis present

## 2013-10-09 DIAGNOSIS — I509 Heart failure, unspecified: Secondary | ICD-10-CM

## 2013-10-09 DIAGNOSIS — Z79899 Other long term (current) drug therapy: Secondary | ICD-10-CM | POA: Insufficient documentation

## 2013-10-09 NOTE — Patient Instructions (Signed)
Follow up in 6 months 

## 2013-11-07 NOTE — Telephone Encounter (Signed)
Open in error

## 2013-11-10 ENCOUNTER — Ambulatory Visit (INDEPENDENT_AMBULATORY_CARE_PROVIDER_SITE_OTHER): Admitting: Internal Medicine

## 2013-11-10 ENCOUNTER — Encounter: Payer: Self-pay | Admitting: Internal Medicine

## 2013-11-10 VITALS — BP 102/40 | HR 70 | Ht 65.0 in | Wt 89.2 lb

## 2013-11-10 DIAGNOSIS — I495 Sick sinus syndrome: Secondary | ICD-10-CM

## 2013-11-10 DIAGNOSIS — I442 Atrioventricular block, complete: Secondary | ICD-10-CM

## 2013-11-10 DIAGNOSIS — Z950 Presence of cardiac pacemaker: Secondary | ICD-10-CM

## 2013-11-10 NOTE — Progress Notes (Signed)
Patient Care Team: Jacques NavyMichael E Norins, MD as PCP - General   HPI  Melanie Clements is a 80 y.o. female Seen following pacemaker implantation for complete heart block-9/13  she had a prior history of dizziness and weakness but without syncope.  She has modest shortness of breath. She was earlier this year hospitalized because of volume overload. The daughters do a very meticulous job trying to control her diet and sodium intake largely successfully. She has been followed in the heart failure clinic  She has had no edema of late   Past Medical History  Diagnosis Date  . Acute upper gastrointestinal hemorrhage     2008  . Stiffness of joint, lower leg   . Constipation   . Weakness   . Dyspepsia   . Pulmonary nodule     right upper lobe  . Anemia   . Osteoporosis   . Glaucoma   . Hypertension   . Hyperlipidemia   . Atrioventricular block, complete   . CHF (congestive heart failure)     diastolic and systolic (EF 35-40%)  . Pacemaker-St.Jude-single chamber 10/18/2011    Past Surgical History  Procedure Laterality Date  . Cataract extraction    . Upper gastrointestinal endoscopy    . Colonoscopy    . Pacemaker insertion  10/16/2011    St Jude    Current Outpatient Prescriptions  Medication Sig Dispense Refill  . Acetaminophen (TYLENOL PO) Take 1 tablet by mouth daily.      . Artificial Tear Ointment (EYE LUBRICANT) OINT Apply 1 application to eye at bedtime.      . brinzolamide (AZOPT) 1 % ophthalmic suspension Place 1 drop into both eyes 3 (three) times daily.      . calcium carbonate (TUMS LASTING EFFECTS) 500 MG chewable tablet Chew 1 tablet by mouth 2 (two) times daily as needed. For heartburn      . furosemide (LASIX) 40 MG tablet Take 1 tablet (40 mg total) by mouth daily.  35 tablet  3  . latanoprost (XALATAN) 0.005 % ophthalmic solution Place 1 drop into both eyes at bedtime.      Marland Kitchen. loratadine (CLARITIN) 10 MG tablet Take 10 mg by mouth daily as needed for  allergies.       Marland Kitchen. loteprednol (LOTEMAX) 0.5 % ophthalmic suspension Place 1 drop into the right eye 3 (three) times daily.       . mineral oil liquid Take 15 mLs by mouth daily as needed for constipation.  180 mL  12  . pantoprazole (PROTONIX) 40 MG tablet Take 1 tablet (40 mg total) by mouth daily.  90 tablet  1   No current facility-administered medications for this visit.    Allergies  Allergen Reactions  . Risedronate Sodium Diarrhea    Review of Systems negative except from HPI and PMH  Physical Exam BP 102/40  Pulse 70  Ht 5\' 5"  (1.651 m)  Wt 89 lb 3.2 oz (40.461 kg)  BMI 14.84 kg/m2 Well developed and well nourished in no acute distress HENT normal E scleral and icterus clear Neck Supple RRR Device pocket well healed; without hematoma or erythema abdomen soft bowel sounds No clubbing cyanosis none Edema Alert and oriented, grossly normal motor and sensory function Skin Warm and Dry  electrocardiogram demonstrates probable underlying atrial  fibrillation with ventricular pacing  Atrial fib--paroxysmal   Complete heart block  Pacemaker  St Jude    Overall relatively stable despite age and chronic conditions

## 2013-11-10 NOTE — Patient Instructions (Addendum)
Your physician recommends that you schedule a follow-up appointment in: 6 months with the Device Clinic and in 12 months with Dr.Klein  Your physician recommends that you continue on your current medications as directed. Please refer to the Current Medication list given to you today.

## 2013-11-11 LAB — MDC_IDC_ENUM_SESS_TYPE_INCLINIC
Battery Impedance: 1000 Ohm
Battery Voltage: 2.79 V
Date Time Interrogation Session: 20151005164629
Implantable Pulse Generator Serial Number: 7303390
Lead Channel Pacing Threshold Pulse Width: 0.4 ms
Lead Channel Setting Pacing Pulse Width: 0.4 ms
Lead Channel Setting Sensing Sensitivity: 2 mV
MDC IDC MSMT LEADCHNL RV IMPEDANCE VALUE: 587 Ohm
MDC IDC MSMT LEADCHNL RV PACING THRESHOLD AMPLITUDE: 0.625 V

## 2013-11-19 ENCOUNTER — Encounter: Payer: Self-pay | Admitting: Internal Medicine

## 2013-11-22 ENCOUNTER — Ambulatory Visit (INDEPENDENT_AMBULATORY_CARE_PROVIDER_SITE_OTHER): Payer: Medicare Other

## 2013-11-22 DIAGNOSIS — Z23 Encounter for immunization: Secondary | ICD-10-CM

## 2013-12-05 ENCOUNTER — Other Ambulatory Visit: Payer: Self-pay | Admitting: Internal Medicine

## 2013-12-05 ENCOUNTER — Other Ambulatory Visit (HOSPITAL_COMMUNITY): Payer: Self-pay | Admitting: Anesthesiology

## 2013-12-05 DIAGNOSIS — I5022 Chronic systolic (congestive) heart failure: Secondary | ICD-10-CM

## 2014-01-15 ENCOUNTER — Encounter (HOSPITAL_COMMUNITY): Payer: Self-pay | Admitting: Internal Medicine

## 2014-03-07 ENCOUNTER — Other Ambulatory Visit: Payer: Self-pay | Admitting: Internal Medicine

## 2014-03-09 ENCOUNTER — Other Ambulatory Visit: Payer: Self-pay | Admitting: Internal Medicine

## 2014-03-10 ENCOUNTER — Other Ambulatory Visit (HOSPITAL_COMMUNITY): Payer: Self-pay | Admitting: *Deleted

## 2014-03-10 MED ORDER — PANTOPRAZOLE SODIUM 40 MG PO TBEC: 40.0000 mg | DELAYED_RELEASE_TABLET | Freq: Every day | ORAL | Status: DC

## 2014-03-19 ENCOUNTER — Telehealth (HOSPITAL_COMMUNITY): Payer: Self-pay | Admitting: Vascular Surgery

## 2014-03-19 NOTE — Telephone Encounter (Signed)
Nurse from Hospice called pt has gained 10 lbs in lat 3 lbs, pt bp 110//60. Pt saw Hospice doctor he is not comfortable prescribing a diuretic , they want the pt to be seen ASAP.Marland Kitchen. Please advise FYI.. Nurse states she has been having problems with getting a call back from this office she would like a call back today

## 2014-03-19 NOTE — Telephone Encounter (Signed)
Advised Hospice nurse to increase lasix to 40mg  twice daily for the next 3 days, then resume to 40mg  once daily.  Aware and agreeable.  Ave FilterBradley, Kennidee Heyne Genevea

## 2014-03-23 ENCOUNTER — Telehealth (HOSPITAL_COMMUNITY): Payer: Self-pay | Admitting: Vascular Surgery

## 2014-03-23 NOTE — Telephone Encounter (Signed)
Pt daughter called she wants to speak to someone concerning her moms appointment, she did not want to make an appointment at this time because she wants to speak to Amy to make sure she needs to be seen with her being on Hospice.. pleaseadvise

## 2014-03-23 NOTE — Telephone Encounter (Signed)
Spoke with daughter who states patient is doing much better since having extra lasix x 3 days last week per Amy Clegg NP-C (see previous phone note).  Asked if patient should still have routine follow up appointments since transporting patient is very hard on her as she is primarily bed bound now with hospice.  Advised that as long as her symptoms are managed well and her needs and personal wishes are being met from medical standpoint that they can follow up with [place] PR[facility] as far as having an in-clinic appointment.  Otherwise, we will continue to co-manage her care with home hospice via phone and fax with updates and orders.  Sister and patient aware and agreeable.  Renee Pain

## 2014-04-08 ENCOUNTER — Telehealth (HOSPITAL_COMMUNITY): Payer: Self-pay | Admitting: Vascular Surgery

## 2014-04-08 NOTE — Telephone Encounter (Signed)
Hospice nurse Pt has been is showing some decline.. room air 86% , weight 98.6 today,  weight up 2 bls from last week , bp 100/70, some edema in legs, increased SOB

## 2014-04-08 NOTE — Telephone Encounter (Signed)
Spoke w/Hospice Charity fundraiserN, she states pt is really going down hill, edema in her feet and ankles, 1+ pitting on left leg and trace on right, increase confusion, o2 sat 86% RA she advised pt to wear her oxygen, but states pt takes it off at times.  Pt is getting up and moving around some but staying mostly in the bed.  Gave order for pt to take extra 40 mg of lasix as needed for SOB or wt gain to keep pt comfortable.

## 2014-04-28 ENCOUNTER — Telehealth (HOSPITAL_COMMUNITY): Payer: Self-pay | Admitting: Vascular Surgery

## 2014-04-28 NOTE — Telephone Encounter (Signed)
Spoke w/Melanie Clements, she states pt is doing well, she feels the edema is slightly improved and pt had less labored breathing that usual, overall she feels pt is having a good day just wanted to let us know

## 2014-04-28 NOTE — Telephone Encounter (Signed)
Nurse from Hospice called FYI... pt has some weight gained 99.8, no SOB, 02 94% on room air.Marland Kitchen. added benedryl 25 mg @ night to help her sleep

## 2014-05-14 ENCOUNTER — Telehealth: Payer: Self-pay | Admitting: Internal Medicine

## 2014-05-14 NOTE — Telephone Encounter (Signed)
New message  The family asked the nurse to call and determine if the pt should come in for the follow up pacer check being that she is in hospice and currently on oxygen. The nurse states that she would be sure to assist in any way that she can. Please call back to discuss

## 2014-05-14 NOTE — Telephone Encounter (Signed)
Routing to device clinic to address 

## 2014-05-15 NOTE — Telephone Encounter (Signed)
Pt's pulse consistently been in 60s-80s per Maura from hospice care. Pt has not been bradycardic. Pt is O2 dependent w/ exertion. Sharman CrateMaura has cared for her for one year and noticed increased decline.   **Per Sharman CrateMaura, pt given estimated six months until end of life.**  Due to being a single chamber device, I advised Maura it's okay to skip a device clinic check. Sharman CrateMaura is aware that pt is dependent. If Sharman CrateMaura et al decide pt needs a device check, they have the option to call [employer] to request a rep to check.  Notes also updated in [employer].

## 2014-07-20 ENCOUNTER — Encounter: Payer: Self-pay | Admitting: *Deleted

## 2014-07-21 ENCOUNTER — Other Ambulatory Visit (HOSPITAL_COMMUNITY): Payer: Self-pay | Admitting: Internal Medicine

## 2014-08-13 ENCOUNTER — Telehealth (HOSPITAL_COMMUNITY): Payer: Self-pay | Admitting: *Deleted

## 2014-08-13 NOTE — Telephone Encounter (Signed)
Maura, RN called to report pt has been very agitated for past couple of days and they would like to admit her to [facility] to get her symptoms under control, gave ok for pt to be transferred, she states pt is nearing end of life and may stay at [facility] until the end or if symptoms are under control they could possible send her back home after a few days in which case she will let us know

## 2014-09-01 ENCOUNTER — Other Ambulatory Visit (HOSPITAL_COMMUNITY): Payer: Self-pay | Admitting: Internal Medicine

## 2014-09-07 DEATH — deceased

## 2014-09-21 ENCOUNTER — Telehealth: Payer: Self-pay | Admitting: Internal Medicine

## 2014-09-21 NOTE — Telephone Encounter (Signed)
New message      Daughter is calling to let us know pt died on Sep 26, 2014.  She received a letter to call and schedule an appt with Dr Graciela Husbands.

## 2014-09-21 NOTE — Telephone Encounter (Signed)
Noted in [employer]. 

## 2014-10-22 ENCOUNTER — Encounter: Payer: Self-pay | Admitting: Internal Medicine

## 2015-05-22 IMAGING — CR DG CHEST 2V
1 series · 1 of 1 positions shown · non-contrast
Comparison: DG CHEST 2 VIEW dated 02/08/2012; DG CHEST 2 VIEW dated
06/22/2010; CT CHEST W/O CM dated 08/03/2009

CLINICAL DATA: Congestive heart failure.

EXAM:
CHEST  2 VIEW

[view not recorded]
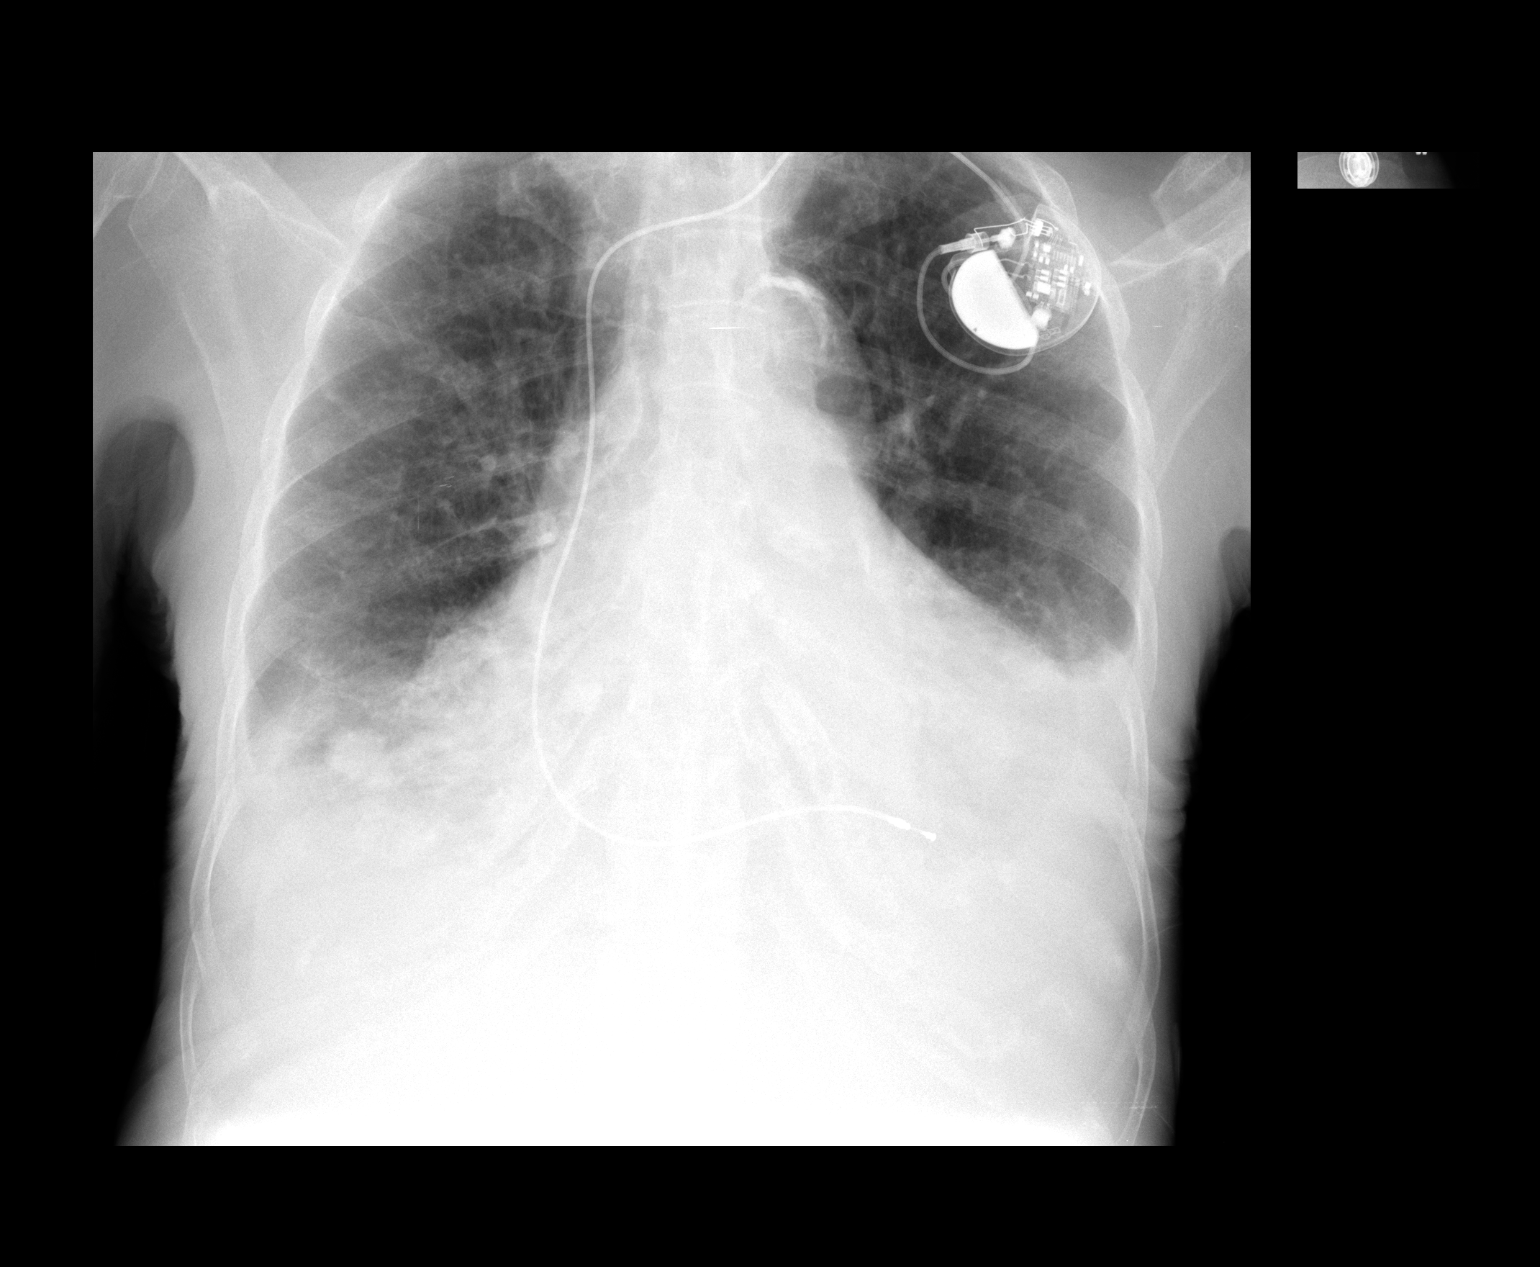

[1 of 1 positions shown; findings below may reference images not displayed]

FINDINGS: Single lead pacer with tip at right ventricle. Midline trachea.
Moderate cardiomegaly with atherosclerosis in the transverse aorta.
Left greater than right bilateral pleural effusions. The left-sided
pleural effusion is slightly decreased. The right-sided pleural
effusion is similar to minimally increased. No pneumothorax. Low
lung volumes. Mild pulmonary venous congestion. Increased right and
similar to slightly improved left base airspace disease. A nodular
density projecting over the right lung base on the frontal
radiograph likely corresponds to a nodule which was evaluated on
prior chest CTs and PET.
IMPRESSION: Cardiomegaly with mild pulmonary venous congestion.

Small bilateral pleural effusions with adjacent atelectasis or
infection. Recommend radiographic follow-up until clearing.

Probable chronic right lung base nodule. Recommend attention on
follow-up. Presuming this is the same nodule, this was evaluated on
prior exams, including back to the CT of 08/03/2009.

## 2015-05-24 IMAGING — RF DG ESOPHAGUS
15 of 19 series · 15 of 19 positions shown · non-contrast
Comparison: None.

FLUOROSCOPY TIME:  2 min 8 seconds

CLINICAL DATA: Dysphagia.  Difficulty swallowing.

EXAM:
ESOPHOGRAM/BARIUM SWALLOW
TECHNIQUE: Single contrast examination was performed using thin and thick
barium and 13 mm barium tablet.

[Series 1: run · 1 of 1 slices shown (1 of 15)]
[im 1/1]
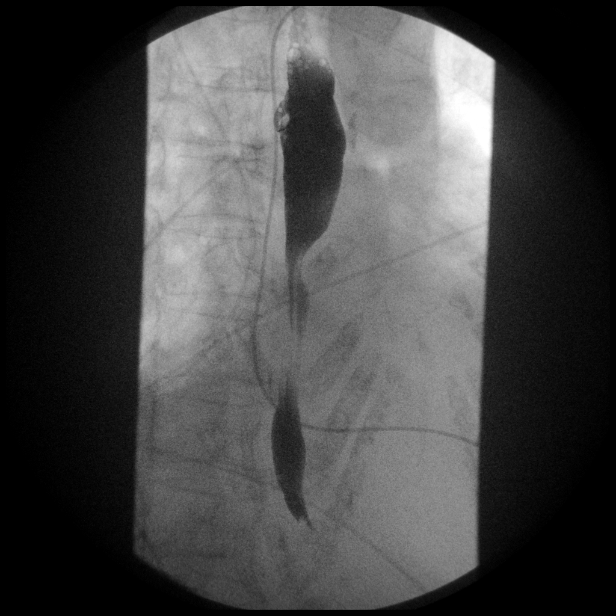

[Series 2: run · 1 of 1 slices shown (2 of 15)]
[im 1/1]
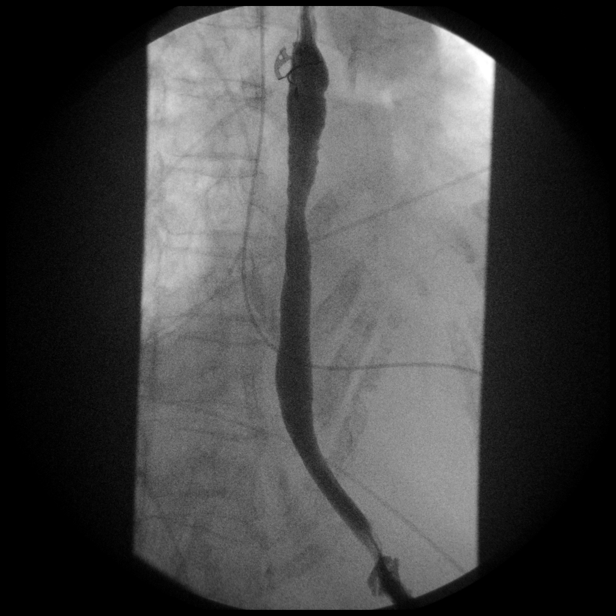

[Series 4: run · 1 of 1 slices shown (3 of 15)]
[im 1/1]
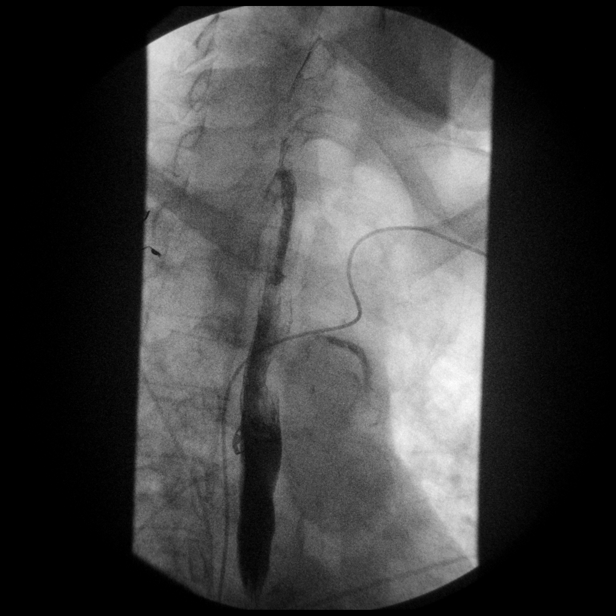

[Series 5: run · 1 of 1 slices shown (4 of 15)]
[im 1/1]
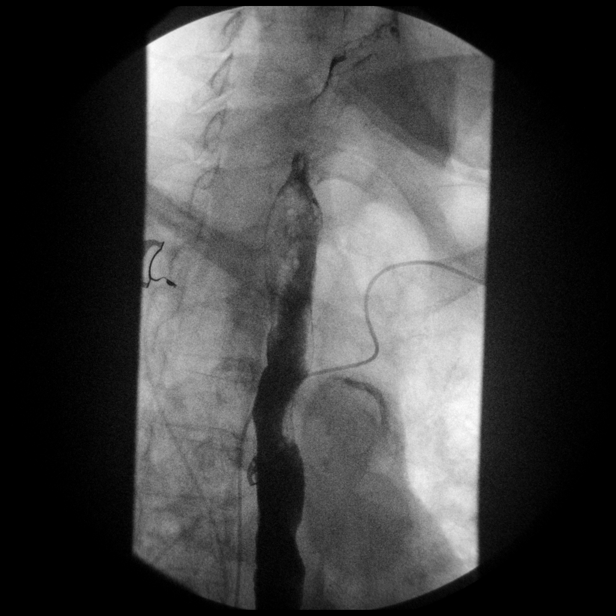

[Series 6: run · 1 of 1 slices shown (5 of 15)]
[im 1/1]
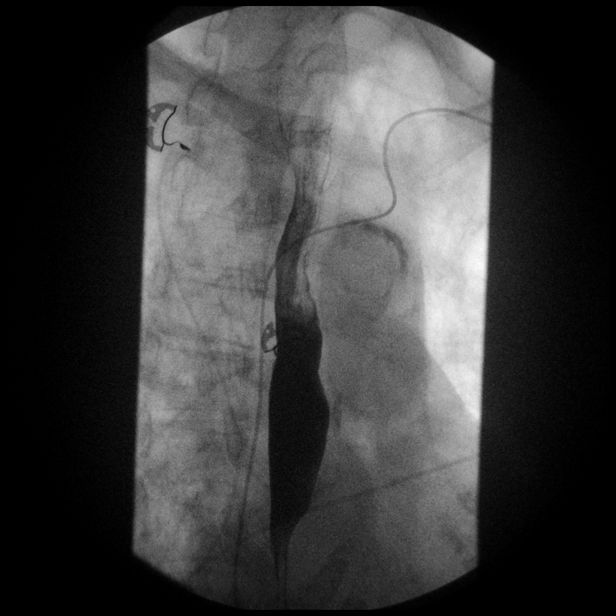

[Series 7: run · 1 of 1 slices shown (6 of 15)]
[im 1/1]
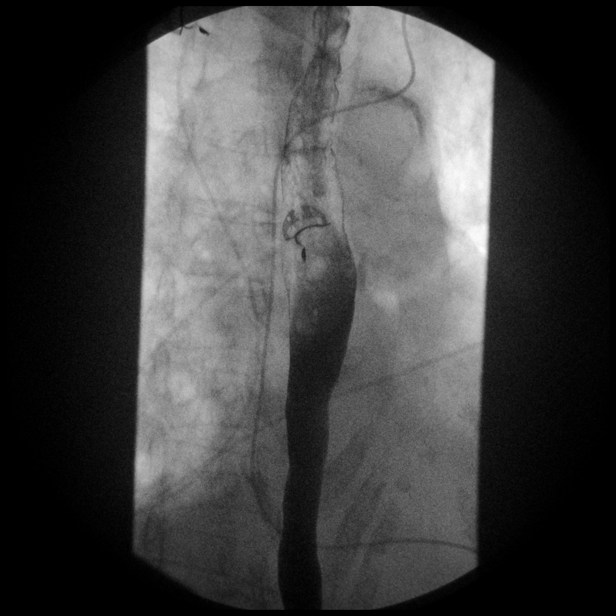

[Series 9: run · 1 of 1 slices shown (7 of 15)]
[im 1/1]
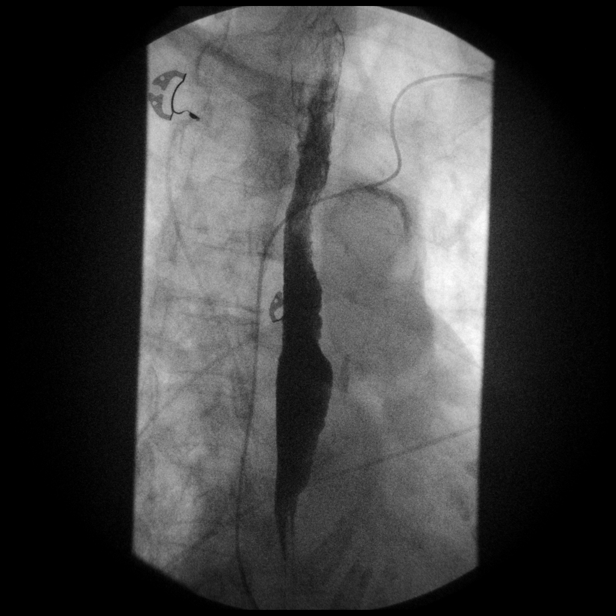

[Series 10: run · 1 of 1 slices shown (8 of 15)]
[im 1/1]
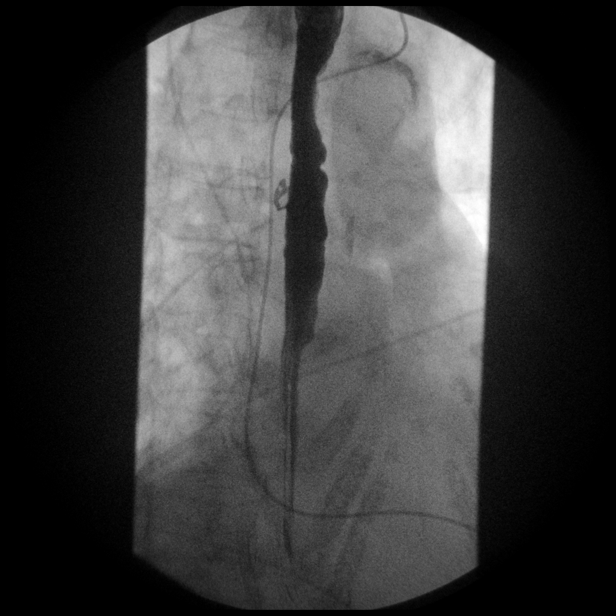

[Series 11: run · 1 of 1 slices shown (9 of 15)]
[im 1/1]
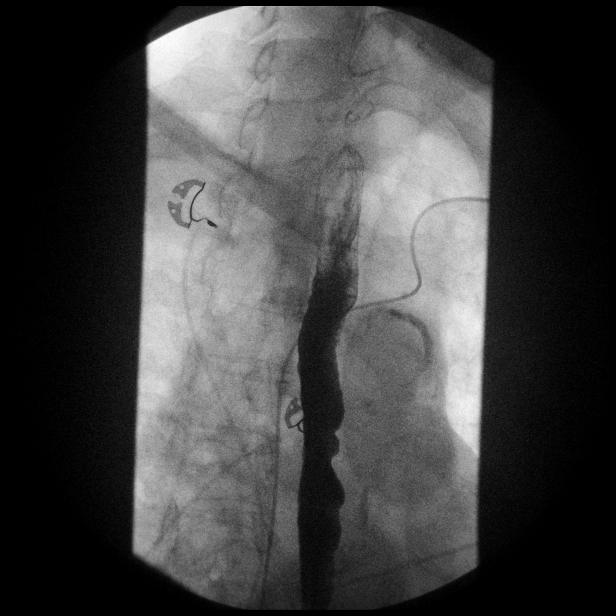

[Series 13: run · 1 of 1 slices shown (10 of 15)]
[im 1/1]
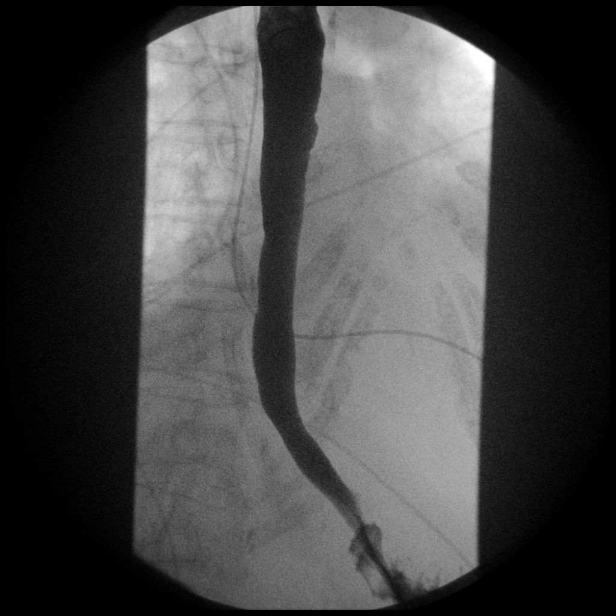

[Series 14: run · 1 of 1 slices shown (11 of 15)]
[im 1/1]
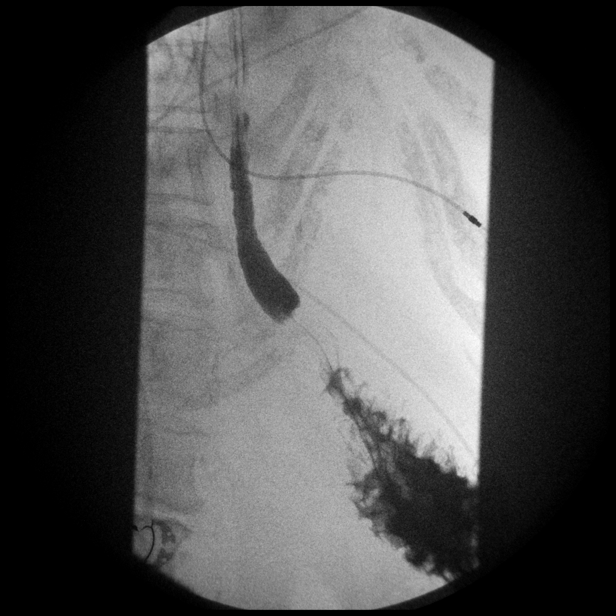

[Series 15: run · 1 of 1 slices shown (12 of 15)]
[im 1/1]
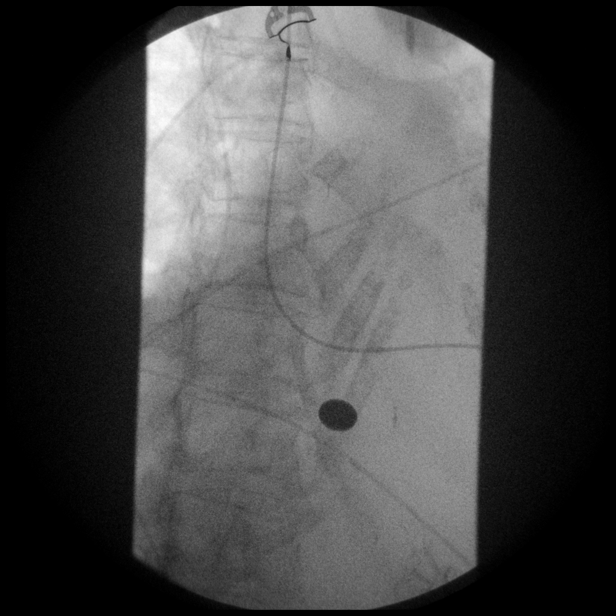

[Series 16: run · 1 of 1 slices shown (13 of 15)]
[im 1/1]
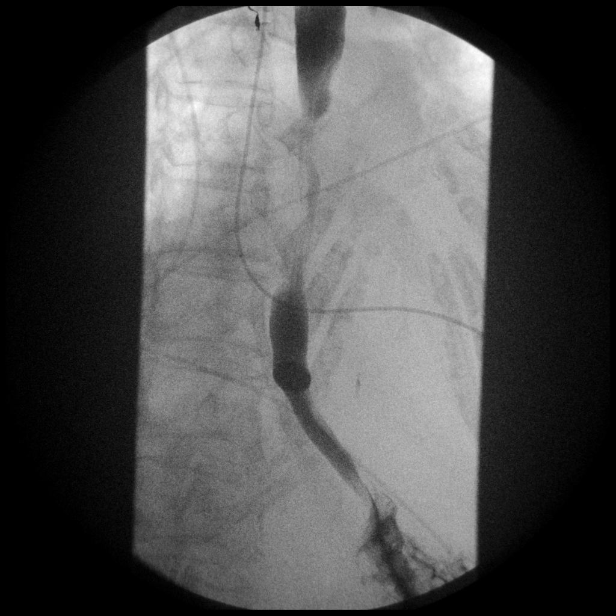

[Series 18: run · 1 of 1 slices shown (14 of 15)]
[im 1/1]
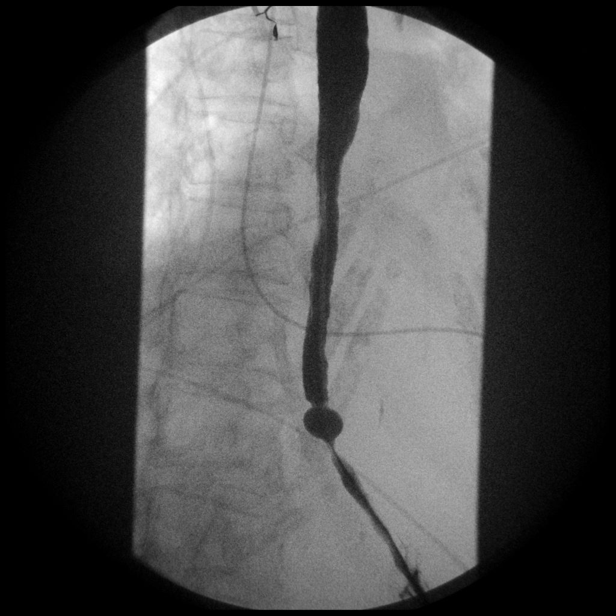

[Series 19: run · 1 of 1 slices shown (15 of 15)]
[im 1/1]
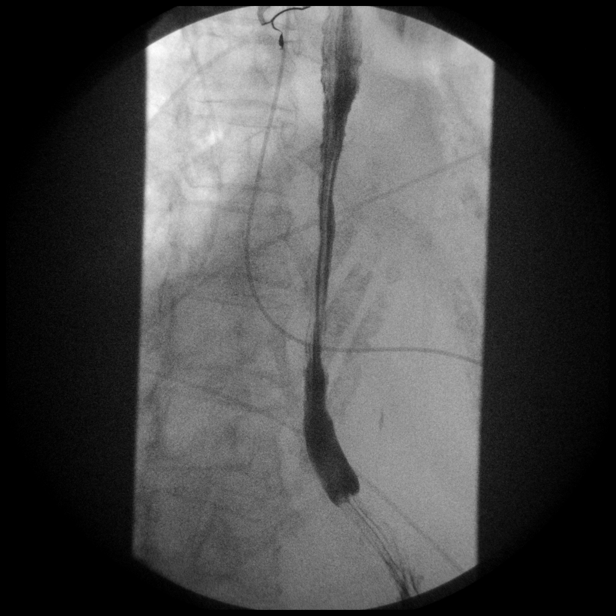

[15 of 19 positions shown; findings below may reference images not displayed]

FINDINGS: The patient has a poor secondary stripping wave and occasional
tertiary contractions in the esophagus. There is no stricture or
mass or hiatal hernia. A 13 mm barium tablet passed from the mouth
to the stomach without significant delay. The patient did have
prolonged oral stage difficulty swallowing the tablet.
IMPRESSION: 1. Diffuse esophageal motility disorder with a poor secondary
stripping waves and occasional tertiary contractions.
2. Oral stage difficulty swallowing the tablet.
3. Otherwise normal.
